# Patient Record
Sex: Male | Born: 1970 | Race: Black or African American | Hispanic: No | Marital: Married | Smoking: Never smoker
Health system: Southern US, Community
[De-identification: ages and names within clinical notes are randomized; demographics above are authoritative.]

## PROBLEM LIST (undated history)

## (undated) DIAGNOSIS — D649 Anemia, unspecified: Secondary | ICD-10-CM

## (undated) DIAGNOSIS — R7303 Prediabetes: Secondary | ICD-10-CM

## (undated) DIAGNOSIS — I10 Essential (primary) hypertension: Secondary | ICD-10-CM

## (undated) HISTORY — DX: Anemia, unspecified: D64.9

## (undated) HISTORY — DX: Essential (primary) hypertension: I10

## (undated) HISTORY — DX: Prediabetes: R73.03

---

## 2013-03-15 ENCOUNTER — Ambulatory Visit (INDEPENDENT_AMBULATORY_CARE_PROVIDER_SITE_OTHER): Payer: BC Managed Care – PPO | Admitting: Family Medicine

## 2013-03-15 VITALS — BP 148/112 | HR 69 | Temp 98.5°F | Resp 16 | Ht 66.0 in | Wt 155.0 lb

## 2013-03-15 DIAGNOSIS — N419 Inflammatory disease of prostate, unspecified: Secondary | ICD-10-CM

## 2013-03-15 DIAGNOSIS — R361 Hematospermia: Secondary | ICD-10-CM

## 2013-03-15 DIAGNOSIS — I10 Essential (primary) hypertension: Secondary | ICD-10-CM

## 2013-03-15 LAB — POCT CBC
Granulocyte percent: 73.2 %G (ref 37–80)
MCV: 90.3 fL (ref 80–97)
MID (cbc): 0.4 (ref 0–0.9)
MPV: 11.1 fL (ref 0–99.8)
POC LYMPH PERCENT: 22 %L (ref 10–50)
POC MID %: 4.8 %M (ref 0–12)
Platelet Count, POC: 225 10*3/uL (ref 142–424)
RDW, POC: 14 %

## 2013-03-15 LAB — POCT URINALYSIS DIPSTICK
Blood, UA: NEGATIVE
Nitrite, UA: NEGATIVE
Protein, UA: 30
Spec Grav, UA: >=1.03
Urobilinogen, UA: 1

## 2013-03-15 LAB — POCT UA - MICROSCOPIC ONLY
Crystals, Ur, HPF, POC: NEGATIVE
Yeast, UA: NEGATIVE

## 2013-03-15 LAB — COMPREHENSIVE METABOLIC PANEL
ALT: 18 U/L (ref 0–53)
AST: 19 U/L (ref 0–37)
Albumin: 4.4 g/dL (ref 3.5–5.2)
Alkaline Phosphatase: 51 U/L (ref 39–117)
Glucose, Bld: 92 mg/dL (ref 70–99)
Potassium: 4.4 mEq/L (ref 3.5–5.3)
Sodium: 140 mEq/L (ref 135–145)
Total Bilirubin: 0.9 mg/dL (ref 0.3–1.2)
Total Protein: 7.6 g/dL (ref 6.0–8.3)

## 2013-03-15 LAB — PSA: PSA: 1.46 ng/mL (ref <=4.00)

## 2013-03-15 MED ORDER — LISINOPRIL-HYDROCHLOROTHIAZIDE 10-12.5 MG PO TABS: 1.0000 | ORAL_TABLET | Freq: Every day | ORAL | Status: DC

## 2013-03-15 MED ORDER — CIPROFLOXACIN HCL 500 MG PO TABS: 500.0000 mg | ORAL_TABLET | Freq: Two times a day (BID) | ORAL | Status: DC

## 2013-03-15 NOTE — Patient Instructions (Addendum)
Take your medications faithfully  If more bleeding come in sooner, otherwise return in one month

## 2013-03-15 NOTE — Progress Notes (Signed)
Subjective  42 year old gentleman with a history of having had his blood pressure high when he went to a dentist not long ago. He was told that he could not have the procedure done due to the elevation of the blood pressure. He is a strong family history for high blood pressure apparently. He has never been treated for it.  He also has noticed blood in his semen on several occasions recently on her masturbated specimen. He knows of no trauma. Has had mild left lower quadrant pain but no significant pain. Had not seen this in the past. He has not been within the other woman since she's been with his wife. He knows of no reason for being concern about STDs.  He drinks about a half a dozen Pepsi's a day.   Objective: Healthy-appearing young man in no major distress. Chest clear. Heart regular. Abdomen soft nontender. Normal male external genitalia. Testes descended. No hernias. Digital rectal exam reveals prostate to be upper normal in size but no nodules were noted. No real tenderness to it.  Assessment: Hypertension Hematospermia  Plan: Check blood work and urinalysis  Results for orders placed in visit on 03/15/13  POCT GLYCOSYLATED HEMOGLOBIN (HGB A1C)      Result Value Range   Hemoglobin A1C 5.3    POCT CBC      Result Value Range   WBC 7.8  4.6 - 10.2 K/uL   Lymph, poc 1.7  0.6 - 3.4   POC LYMPH PERCENT 22.0  10 - 50 %L   MID (cbc) 0.4  0 - 0.9   POC MID % 4.8  0 - 12 %M   POC Granulocyte 5.7  2 - 6.9   Granulocyte percent 73.2  37 - 80 %G   RBC 5.05  4.69 - 6.13 M/uL   Hemoglobin 14.2  14.1 - 18.1 g/dL   HCT, POC 30.8  65.7 - 53.7 %   MCV 90.3  80 - 97 fL   MCH, POC 28.1  27 - 31.2 pg   MCHC 31.1 (*) 31.8 - 35.4 g/dL   RDW, POC 84.6     Platelet Count, POC 225  142 - 424 K/uL   MPV 11.1  0 - 99.8 fL  POCT URINALYSIS DIPSTICK      Result Value Range   Color, UA yellow     Clarity, UA slightly cloudy     Glucose, UA neg     Bilirubin, UA neg     Ketones, UA neg     Spec Grav, UA >=1.030     Blood, UA neg     pH, UA 5.5     Protein, UA 30     Urobilinogen, UA 1.0     Nitrite, UA neg     Leukocytes, UA Negative    POCT UA - MICROSCOPIC ONLY      Result Value Range   WBC, Ur, HPF, POC 1-3     RBC, urine, microscopic neg     Bacteria, U Microscopic neg     Mucus, UA neg     Epithelial cells, urine per micros 0-2     Crystals, Ur, HPF, POC neg     Casts, Ur, LPF, POC neg     Yeast, UA neg     Will treat with Cipro. Also will treat the high blood pressure. Have him return in 1 month.  Final diagnoses: Hematospermia Probable prostatitis Hypertension

## 2013-10-07 ENCOUNTER — Telehealth: Payer: Self-pay

## 2013-10-07 DIAGNOSIS — I10 Essential (primary) hypertension: Secondary | ICD-10-CM

## 2013-10-07 NOTE — Telephone Encounter (Signed)
Can send 1 mo but not 3, he needs follow up he states he will come in on Thursday, no need to send in the one month supply

## 2013-10-07 NOTE — Telephone Encounter (Signed)
Patient would like a 90 day supply on his lisinopril if possible to be sent to the [facility] on [address]

## 2014-03-20 ENCOUNTER — Ambulatory Visit (INDEPENDENT_AMBULATORY_CARE_PROVIDER_SITE_OTHER): Payer: BC Managed Care – PPO | Admitting: Family Medicine

## 2014-03-20 VITALS — BP 124/84 | HR 64 | Temp 98.2°F | Resp 16 | Ht 66.0 in | Wt 168.0 lb

## 2014-03-20 DIAGNOSIS — B49 Unspecified mycosis: Secondary | ICD-10-CM

## 2014-03-20 DIAGNOSIS — Z131 Encounter for screening for diabetes mellitus: Secondary | ICD-10-CM

## 2014-03-20 DIAGNOSIS — I10 Essential (primary) hypertension: Secondary | ICD-10-CM

## 2014-03-20 DIAGNOSIS — R0982 Postnasal drip: Secondary | ICD-10-CM

## 2014-03-20 LAB — POCT GLYCOSYLATED HEMOGLOBIN (HGB A1C): Hemoglobin A1C: 5.8

## 2014-03-20 MED ORDER — FLUTICASONE PROPIONATE 50 MCG/ACT NA SUSP: 2.0000 | Freq: Every day | NASAL | Status: DC

## 2014-03-20 MED ORDER — LISINOPRIL-HYDROCHLOROTHIAZIDE 10-12.5 MG PO TABS: 1.0000 | ORAL_TABLET | Freq: Every day | ORAL | Status: DC

## 2014-03-20 NOTE — Progress Notes (Signed)
Chief Complaint:  Chief Complaint  Patient presents with  . Medication Refill    HPI: George Richardson is a 43 y.o. male who is here for  HTN Refills Doing well, No SE except possible dry cough last 5 months, does not bother him does not want to change his ACEI to something that may not have cough SE He has fungus in nails of left hand middle and pinky finger He also has fungal infection on nail of his big toe Deneis diabetes.   Past Medical History  Diagnosis Date  . Anemia    No past surgical history on file. History   Social History  . Marital Status: Married    Spouse Name: N/A    Number of Children: N/A  . Years of Education: N/A   Social History Main Topics  . Smoking status: Never Smoker   . Smokeless tobacco: Not on file  . Alcohol Use: No  . Drug Use: No  . Sexual Activity: Yes    Partners: Female   Other Topics Concern  . Not on file   Social History Narrative  . No narrative on file   No family history on file. No Known Allergies Prior to Admission medications   Medication Sig Start Date End Date Taking? Authorizing Provider  lisinopril-hydrochlorothiazide (PRINZIDE,ZESTORETIC) 10-12.5 MG per tablet Take 1 tablet by mouth daily. 03/15/13  Yes Peyton Najjar, MD  ciprofloxacin (CIPRO) 500 MG tablet Take 1 tablet (500 mg total) by mouth 2 (two) times daily. 03/15/13   Peyton Najjar, MD     ROS: The patient denies fevers, chills, night sweats, unintentional weight loss, chest pain, palpitations, wheezing, dyspnea on exertion, nausea, vomiting, abdominal pain, dysuria, hematuria, melena, numbness, weakness, or tingling.   All other systems have been reviewed and were otherwise negative with the exception of those mentioned in the HPI and as above.    PHYSICAL EXAM: Filed Vitals:   03/20/14 1737  BP: 124/84  Pulse: 64  Temp: 98.2 F (36.8 C)  Resp: 16   Filed Vitals:   03/20/14 1737  Height: 5\' 6"  (1.676 m)  Weight: 168 lb (76.204 kg)    Body mass index is 27.13 kg/(m^2).  General: Alert, no acute distress HEENT:  Normocephalic, atraumatic, oropharynx patent. EOMI, PERRLA Cardiovascular:  Regular rate and rhythm, no rubs murmurs or gallops.  No Carotid bruits, radial pulse intact. No pedal edema.  Respiratory: Clear to auscultation bilaterally.  No wheezes, rales, or rhonchi.  No cyanosis, no use of accessory musculature GI: No organomegaly, abdomen is soft and non-tender, positive bowel sounds.  No masses. Skin: No rashes. Neurologic: Facial musculature symmetric. Psychiatric: Patient is appropriate throughout our interaction. Lymphatic: No cervical lymphadenopathy Musculoskeletal: Gait intact.   LABS: Results for orders placed in visit on 03/20/14  POCT GLYCOSYLATED HEMOGLOBIN (HGB A1C)      Result Value Ref Range   Hemoglobin A1C 5.8       EKG/XRAY:   Primary read interpreted by Dr. Conley Rolls at [facility].   ASSESSMENT/PLAN: Encounter Diagnoses  Name Primary?  . HTN (hypertension) Yes  . PND (post-nasal drip)   . Fungus infection   . Screening for diabetes mellitus    If CMP is normal then will give lamsil for 6 weeks then recheck if improve, if he stil has toe nail onychomycosis then will give additional 6 weeks if no liver issues.  F/u in 6 months or prn Trial of flonase to see if he has  PND induced cough since he has allergies, if cough continues to bother him/wife then will change  F/u prn  Gross sideeffects, risk and benefits, and alternatives of medications d/w patient. Patient is aware that all medications have potential sideeffects and we are unable to predict every sideeffect or drug-drug interaction that may occur.  Kuzey Ogata PHUONG, DO 03/20/2014 6:55 PM    03/22/14  Spoke with patient, kidny function worse so will dc lisinopril hctz since i think hctz is cuprit, since also possibly having dry cough with ace i then will change to ARB. F/u in 2 weeks with bp logs and repeat bmp in apptr office

## 2014-03-21 LAB — COMPREHENSIVE METABOLIC PANEL WITH GFR
Albumin: 4.5 g/dL (ref 3.5–5.2)
BUN: 13 mg/dL (ref 6–23)
CO2: 31 meq/L (ref 19–32)
Calcium: 10.1 mg/dL (ref 8.4–10.5)
Chloride: 99 meq/L (ref 96–112)
Creat: 1.61 mg/dL — ABNORMAL HIGH (ref 0.50–1.35)
Glucose, Bld: 89 mg/dL (ref 70–99)
Potassium: 4.3 meq/L (ref 3.5–5.3)

## 2014-03-21 LAB — COMPREHENSIVE METABOLIC PANEL
ALT: 24 U/L (ref 0–53)
AST: 23 U/L (ref 0–37)
Alkaline Phosphatase: 53 U/L (ref 39–117)
Sodium: 143 mEq/L (ref 135–145)
Total Bilirubin: 0.7 mg/dL (ref 0.2–1.2)
Total Protein: 7.9 g/dL (ref 6.0–8.3)

## 2014-03-21 LAB — MICROALBUMIN, URINE: Microalb, Ur: 0.5 mg/dL (ref 0.00–1.89)

## 2014-03-22 MED ORDER — LOSARTAN POTASSIUM 25 MG PO TABS: 25.0000 mg | ORAL_TABLET | Freq: Every day | ORAL | Status: DC

## 2014-03-22 MED ORDER — TERBINAFINE HCL 250 MG PO TABS: 250.0000 mg | ORAL_TABLET | Freq: Every day | ORAL | Status: DC

## 2014-04-25 ENCOUNTER — Encounter: Payer: Self-pay | Admitting: Family Medicine

## 2014-04-25 ENCOUNTER — Ambulatory Visit (INDEPENDENT_AMBULATORY_CARE_PROVIDER_SITE_OTHER): Payer: BC Managed Care – PPO | Admitting: Family Medicine

## 2014-04-25 VITALS — BP 140/96 | HR 65 | Temp 97.8°F | Resp 16 | Ht 66.0 in | Wt 179.8 lb

## 2014-04-25 DIAGNOSIS — I10 Essential (primary) hypertension: Secondary | ICD-10-CM

## 2014-04-25 DIAGNOSIS — R944 Abnormal results of kidney function studies: Secondary | ICD-10-CM

## 2014-04-25 LAB — COMPLETE METABOLIC PANEL WITH GFR
ALT: 21 U/L (ref 0–53)
BUN: 14 mg/dL (ref 6–23)
CO2: 27 mEq/L (ref 19–32)
Calcium: 9.5 mg/dL (ref 8.4–10.5)
Chloride: 106 mEq/L (ref 96–112)
Creat: 1.42 mg/dL — ABNORMAL HIGH (ref 0.50–1.35)
GFR, Est African American: 70 mL/min
GFR, Est Non African American: 60 mL/min
Glucose, Bld: 78 mg/dL (ref 70–99)
Sodium: 142 mEq/L (ref 135–145)
Total Bilirubin: 0.7 mg/dL (ref 0.2–1.2)

## 2014-04-25 LAB — COMPLETE METABOLIC PANEL WITHOUT GFR
AST: 23 U/L (ref 0–37)
Albumin: 4.5 g/dL (ref 3.5–5.2)
Alkaline Phosphatase: 47 U/L (ref 39–117)
Potassium: 3.9 meq/L (ref 3.5–5.3)
Total Protein: 7.1 g/dL (ref 6.0–8.3)

## 2014-04-25 LAB — MICROALBUMIN, URINE: Microalb, Ur: 0.5 mg/dL (ref 0.00–1.89)

## 2014-04-25 MED ORDER — LOSARTAN POTASSIUM 50 MG PO TABS: 50.0000 mg | ORAL_TABLET | Freq: Every day | ORAL | Status: DC

## 2014-04-25 NOTE — Progress Notes (Signed)
Chief Complaint:  Chief Complaint  Patient presents with  . Follow-up    HTN, recheck to see how med is working    HPI: George Richardson is a 43 y.o. male who is here for hTN follow-up. Dry cough is improved with switch from lisinopril HCTZ to losartan 25 mg.  HTN in 140-150s/90s on losartan 25 mg daily. He has had some deaths  in the family so diet has not been great and follow-up has not been great. NO SEs  Past Medical History  Diagnosis Date  . Anemia    No past surgical history on file. History   Social History  . Marital Status: Married    Spouse Name: N/A    Number of Children: N/A  . Years of Education: N/A   Social History Main Topics  . Smoking status: Never Smoker   . Smokeless tobacco: None  . Alcohol Use: No  . Drug Use: No  . Sexual Activity: Yes    Partners: Female   Other Topics Concern  . None   Social History Narrative  . None   No family history on file. No Known Allergies Prior to Admission medications   Medication Sig Start Date End Date Taking? Authorizing Provider  fluticasone (FLONASE) 50 MCG/ACT nasal spray Place 2 sprays into both nostrils daily. 03/20/14  Yes Thao P Le, DO  losartan (COZAAR) 25 MG tablet Take 1 tablet (25 mg total) by mouth daily. Blood pressure goal less than 150/90, recheck kidneys in 2 weeks 03/22/14  Yes Thao P Le, DO  terbinafine (LAMISIL) 250 MG tablet Take 1 tablet (250 mg total) by mouth daily. 03/22/14  Yes Thao P Le, DO     ROS: The patient denies fevers, chills, night sweats, unintentional weight loss, chest pain, palpitations, wheezing, dyspnea on exertion, nausea, vomiting, abdominal pain, dysuria, hematuria, melena, numbness, weakness, or tingling.   All other systems have been reviewed and were otherwise negative with the exception of those mentioned in the HPI and as above.    PHYSICAL EXAM: Filed Vitals:   04/25/14 0958  BP: 140/96  Pulse: 65  Temp: 97.8 F (36.6 C)  Resp: 16   Filed Vitals:    04/25/14 0958  Height: 5\' 6"  (1.676 m)  Weight: 179 lb 12.8 oz (81.557 kg)   Body mass index is 29.03 kg/(m^2).  General: Alert, no acute distress HEENT:  Normocephalic, atraumatic, oropharynx patent. EOMI, PERRLA, fundo exam normal Cardiovascular:  Regular rate and rhythm, no rubs murmurs or gallops.  No Carotid bruits, radial pulse intact. No pedal edema.  Respiratory: Clear to auscultation bilaterally.  No wheezes, rales, or rhonchi.  No cyanosis, no use of accessory musculature GI: No organomegaly, abdomen is soft and non-tender, positive bowel sounds.  No masses. Skin: No rashes. Neurologic: Facial musculature symmetric. Psychiatric: Patient is appropriate throughout our interaction. Lymphatic: No cervical lymphadenopathy Musculoskeletal: Gait intact.   LABS: Results for orders placed in visit on 03/20/14  COMPREHENSIVE METABOLIC PANEL      Result Value Ref Range   Sodium 143  135 - 145 mEq/L   Potassium 4.3  3.5 - 5.3 mEq/L   Chloride 99  96 - 112 mEq/L   CO2 31  19 - 32 mEq/L   Glucose, Bld 89  70 - 99 mg/dL   BUN 13  6 - 23 mg/dL   Creat 1.611.61 (*) 0.960.50 - 1.35 mg/dL   Total Bilirubin 0.7  0.2 - 1.2 mg/dL   Alkaline  Phosphatase 53  39 - 117 U/L   AST 23  0 - 37 U/L   ALT 24  0 - 53 U/L   Total Protein 7.9  6.0 - 8.3 g/dL   Albumin 4.5  3.5 - 5.2 g/dL   Calcium 40.910.1  8.4 - 81.110.5 mg/dL  MICROALBUMIN, URINE      Result Value Ref Range   Microalb, Ur 0.50  0.00 - 1.89 mg/dL  POCT GLYCOSYLATED HEMOGLOBIN (HGB A1C)      Result Value Ref Range   Hemoglobin A1C 5.8       EKG/XRAY:   Primary read [facility].   ASSESSMENT/PLAN: Encounter Diagnoses  Name Primary?  . HTN (hypertension) Yes  . Abnormal creatinine clearance glomerular filtration    Recheck CMP with GFR amd ,microalbumin Increase losartan to 50 mg daily, may need HCTZ he will call BP goals given 150/90 or less. F/u in 2 weeks by phone with BP , otherwise f/u in 6 months  Gross  sideeffects, risk and benefits, and alternatives of medications d/w patient. Patient is aware that all medications have potential sideeffects and we are unable to predict every sideeffect or drug-drug interaction that may occur.  Lenell Antuhao P Le, DO 04/25/2014 10:13 AM

## 2014-05-06 ENCOUNTER — Encounter: Payer: Self-pay | Admitting: Family Medicine

## 2014-05-21 ENCOUNTER — Other Ambulatory Visit: Payer: Self-pay | Admitting: Family Medicine

## 2014-05-22 ENCOUNTER — Ambulatory Visit: Payer: BC Managed Care – PPO

## 2014-05-22 ENCOUNTER — Ambulatory Visit (INDEPENDENT_AMBULATORY_CARE_PROVIDER_SITE_OTHER): Payer: BC Managed Care – PPO | Admitting: Family Medicine

## 2014-05-22 VITALS — BP 126/88 | HR 80 | Temp 98.5°F | Resp 20 | Ht 65.5 in | Wt 166.4 lb

## 2014-05-22 DIAGNOSIS — M25532 Pain in left wrist: Secondary | ICD-10-CM

## 2014-05-22 DIAGNOSIS — M25539 Pain in unspecified wrist: Secondary | ICD-10-CM

## 2014-05-22 DIAGNOSIS — M67439 Ganglion, unspecified wrist: Secondary | ICD-10-CM

## 2014-05-22 DIAGNOSIS — M6740 Ganglion, unspecified site: Secondary | ICD-10-CM

## 2014-05-22 NOTE — Patient Instructions (Signed)
Ganglion Cyst °A ganglion cyst is a noncancerous, fluid-filled lump that occurs near joints or tendons. The ganglion cyst grows out of a joint or the lining of a tendon. It most often develops in the hand or wrist but can also develop in the shoulder, elbow, hip, knee, ankle, or foot. The round or oval ganglion can be pea sized or larger than a grape. Increased activity may enlarge the size of the cyst because more fluid starts to build up.  °CAUSES  °It is not completely known what causes a ganglion cyst to grow. However, it may be related to: °· Inflammation or irritation around the joint. °· An injury. °· Repetitive movements or overuse. °· Arthritis. °SYMPTOMS  °A lump most often appears in the hand or wrist, but can occur in other areas of the body. Generally, the lump is painless without other symptoms. However, sometimes pain can be felt during activity or when pressure is applied to the lump. The lump may even be tender to the touch. Tingling, pain, numbness, or muscle weakness can occur if the ganglion cyst presses on a nerve. Your grip may be weak and you may have less movement in your joints.  °DIAGNOSIS  °Ganglion cysts are most often diagnosed based on a physical exam, noting where the cyst is and how it looks. Your caregiver will feel the lump and may shine a light alongside it. If it is a ganglion, a light often shines through it. Your caregiver may order an X-ray, ultrasound, or MRI to rule out other conditions. °TREATMENT  °Ganglions usually go away on their own without treatment. If pain or other symptoms are involved, treatment may be needed. Treatment is also needed if the ganglion limits your movement or if it gets infected. Treatment options include: °· Wearing a wrist or finger brace or splint. °· Taking anti-inflammatory medicine. °· Draining fluid from the lump with a needle (aspiration). °· Injecting a steroid into the joint. °· Surgery to remove the ganglion cyst and its stalk that is  attached to the joint or tendon. However, ganglion cysts can grow back. °HOME CARE INSTRUCTIONS  °· Do not press on the ganglion, poke it with a needle, or hit it with a heavy object. You may rub the lump gently and often. Sometimes fluid moves out of the cyst. °· Only take medicines as directed by your caregiver. °· Wear your brace or splint as directed by your caregiver. °SEEK MEDICAL CARE IF:  °· Your ganglion becomes larger or more painful. °· You have increased redness, red streaks, or swelling. °· You have pus coming from the lump. °· You have weakness or numbness in the affected area. °MAKE SURE YOU:  °· Understand these instructions. °· Will watch your condition. °· Will get help right away if you are not doing well or get worse. °Document Released: 12/09/2000 Document Revised: 09/05/2012 Document Reviewed: 02/05/2008 °[employer]® Patient Information ©2014 [employer]. ° °

## 2014-05-22 NOTE — Progress Notes (Signed)
Chief Complaint:  Chief Complaint  Patient presents with  . Wrist Injury    left wrist injury---pain to the wrist.  started yesterday afternoon.     HPI: George Richardson is a 43 y.o. male who is here for left hand numbness and tingling along the thumb distribution, he has been moving a lot and lifting and cleaning. He also works at [employer] at night.  Last 2-3 days worsening sxs where he has pain in addition to weakness with numbness and tingling. He work at [employer] and sp does a lot of lifting normally but never had this before. He states there was a lump on the inner side of his wrist that was fluid filled and large and today is smaller. He has not tried anything for this except otc meds. No prior history of ganglion cyst. No history of DM.   Past Medical History  Diagnosis Date  . Anemia   . Hypertension    No past surgical history on file. History   Social History  . Marital Status: Married    Spouse Name: N/A    Number of Children: N/A  . Years of Education: N/A   Social History Main Topics  . Smoking status: Never Smoker   . Smokeless tobacco: None  . Alcohol Use: No  . Drug Use: No  . Sexual Activity: Yes    Partners: Female   Other Topics Concern  . None   Social History Narrative  . None   History reviewed. No pertinent family history. No Known Allergies Prior to Admission medications   Medication Sig Start Date End Date Taking? Authorizing Provider  fluticasone (FLONASE) 50 MCG/ACT nasal spray Place 2 sprays into both nostrils daily. 03/20/14  Yes Renard Caperton P Frankee Gritz, DO  losartan (COZAAR) 50 MG tablet Take 1 tablet (50 mg total) by mouth daily. 04/25/14  Yes Zareth Rippetoe P Julyssa Kyer, DO  terbinafine (LAMISIL) 250 MG tablet TAKE 1 TABLET (250 MG TOTAL) BY MOUTH DAILY.   Yes Niobe Dick P Trexton Escamilla, DO     ROS: The patient denies fevers, chills, night sweats, unintentional weight loss, chest pain, palpitations, wheezing, dyspnea on exertion, nausea, vomiting, abdominal pain, dysuria, hematuria,  melena  All other systems have been reviewed and were otherwise negative with the exception of those mentioned in the HPI and as above.    PHYSICAL EXAM: Filed Vitals:   05/22/14 1949  BP: 126/88  Pulse: 80  Temp: 98.5 F (36.9 C)  Resp: 20   Filed Vitals:   05/22/14 1949  Height: 5' 5.5" (1.664 m)  Weight: 166 lb 6.4 oz (75.479 kg)   Body mass index is 27.26 kg/(m^2).  General: Alert, no acute distress HEENT:  Normocephalic, atraumatic, oropharynx patent. EOMI, PERRLA Cardiovascular:  Regular rate and rhythm, no rubs murmurs or gallops.  No Carotid bruits, radial pulse intact. No pedal edema.  Respiratory: Clear to auscultation bilaterally.  No wheezes, rales, or rhonchi.  No cyanosis, no use of accessory musculature GI: No organomegaly, abdomen is soft and non-tender, positive bowel sounds.  No masses. Skin: No rashes. Neurologic: Facial musculature symmetric. Psychiatric: Patient is appropriate throughout our interaction. Lymphatic: No cervical lymphadenopathy Musculoskeletal: Gait intact. Left wrist- full ROM, sensation intact, + tinels, 5/5 strength + ganglion cyst on dorsal asect of distal radius adjacent to radial artery    LABS: Results for orders placed in visit on 04/25/14  COMPLETE METABOLIC PANEL WITH GFR      Result Value Ref Range   Sodium  142  135 - 145 mEq/L   Potassium 3.9  3.5 - 5.3 mEq/L   Chloride 106  96 - 112 mEq/L   CO2 27  19 - 32 mEq/L   Glucose, Bld 78  70 - 99 mg/dL   BUN 14  6 - 23 mg/dL   Creat 8.67 (*) 6.72 - 1.35 mg/dL   Total Bilirubin 0.7  0.2 - 1.2 mg/dL   Alkaline Phosphatase 47  39 - 117 U/L   AST 23  0 - 37 U/L   ALT 21  0 - 53 U/L   Total Protein 7.1  6.0 - 8.3 g/dL   Albumin 4.5  3.5 - 5.2 g/dL   Calcium 9.5  8.4 - 09.4 mg/dL   GFR, Est African American 70     GFR, Est Non African American 60    MICROALBUMIN, URINE      Result Value Ref Range   Microalb, Ur 0.50  0.00 - 1.89 mg/dL     EKG/XRAY:   Primary read  interpreted by Dr. Conley Rolls at [facility]. No fx or dislocation + soft tissue swelling on distal radial aspect   ASSESSMENT/PLAN: Encounter Diagnoses  Name Primary?  . Left wrist pain Yes  . Ganglion cyst of wrist    Wrist splint with thumb spica Otc ibuprofen and/or tylenol prn F/u in 1 week or sooner by phone, refer to [facility] for eval if needed  Gross sideeffects, risk and benefits, and alternatives of medications d/w patient. Patient is aware that all medications have potential sideeffects and we are unable to predict every sideeffect or drug-drug interaction that may occur.  Lenell Antu, DO 05/22/2014 8:54 PM

## 2014-05-25 ENCOUNTER — Other Ambulatory Visit: Payer: Self-pay | Admitting: Family Medicine

## 2014-05-26 NOTE — Telephone Encounter (Signed)
I already refilled this? Go ahead and approve it. Thanks

## 2014-08-22 ENCOUNTER — Other Ambulatory Visit: Payer: Self-pay | Admitting: Physician Assistant

## 2014-08-26 ENCOUNTER — Other Ambulatory Visit: Payer: Self-pay | Admitting: Physician Assistant

## 2014-10-24 ENCOUNTER — Other Ambulatory Visit: Payer: Self-pay | Admitting: Family Medicine

## 2014-10-31 ENCOUNTER — Ambulatory Visit: Payer: BC Managed Care – PPO | Admitting: Family Medicine

## 2014-11-21 ENCOUNTER — Other Ambulatory Visit: Payer: Self-pay | Admitting: Family Medicine

## 2014-12-26 ENCOUNTER — Other Ambulatory Visit: Payer: Self-pay | Admitting: Family Medicine

## 2015-01-23 ENCOUNTER — Other Ambulatory Visit: Payer: Self-pay | Admitting: Physician Assistant

## 2015-02-11 ENCOUNTER — Other Ambulatory Visit: Payer: Self-pay | Admitting: Physician Assistant

## 2015-02-14 ENCOUNTER — Ambulatory Visit (INDEPENDENT_AMBULATORY_CARE_PROVIDER_SITE_OTHER): Payer: BLUE CROSS/BLUE SHIELD | Admitting: Physician Assistant

## 2015-02-14 VITALS — BP 148/92 | HR 79 | Temp 97.6°F | Resp 12 | Ht 66.0 in | Wt 169.0 lb

## 2015-02-14 DIAGNOSIS — I10 Essential (primary) hypertension: Secondary | ICD-10-CM

## 2015-02-14 LAB — COMPLETE METABOLIC PANEL WITH GFR
ALBUMIN: 4.3 g/dL (ref 3.5–5.2)
ALT: 23 U/L (ref 0–53)
AST: 22 U/L (ref 0–37)
Alkaline Phosphatase: 50 U/L (ref 39–117)
BILIRUBIN TOTAL: 0.9 mg/dL (ref 0.2–1.2)
BUN: 13 mg/dL (ref 6–23)
CO2: 29 mEq/L (ref 19–32)
Calcium: 9.5 mg/dL (ref 8.4–10.5)
Chloride: 104 mEq/L (ref 96–112)
Creat: 1.3 mg/dL (ref 0.50–1.35)
GFR, EST NON AFRICAN AMERICAN: 67 mL/min
GFR, Est African American: 77 mL/min
GLUCOSE: 86 mg/dL (ref 70–99)
Potassium: 4.2 mEq/L (ref 3.5–5.3)
Sodium: 140 mEq/L (ref 135–145)
Total Protein: 7.3 g/dL (ref 6.0–8.3)

## 2015-02-14 LAB — LIPID PANEL
CHOLESTEROL: 149 mg/dL (ref 0–200)
HDL: 42 mg/dL (ref >39)
LDL Cholesterol: 95 mg/dL (ref 0–99)
TRIGLYCERIDES: 61 mg/dL (ref <150)
Total CHOL/HDL Ratio: 3.5 Ratio
VLDL: 12 mg/dL (ref 0–40)

## 2015-02-14 MED ORDER — LOSARTAN POTASSIUM 50 MG PO TABS: ORAL_TABLET | ORAL | Status: DC

## 2015-02-14 MED ORDER — AMLODIPINE BESYLATE 5 MG PO TABS: 5.0000 mg | ORAL_TABLET | Freq: Every day | ORAL | Status: DC

## 2015-02-14 NOTE — Patient Instructions (Addendum)
-We are adding the amlodipine 5mg  to take once daily.  If you witness any side effects that are concerning contact lus.  Please check your BP 2x/week.  Jot it down on your phone/notebook, etc.  Bring that to your physical exam. -I will contact you on the results of your labs within the next 10 days.    DASH Eating Plan DASH stands for "Dietary Approaches to Stop Hypertension." The DASH eating plan is a healthy eating plan that has been shown to reduce high blood pressure (hypertension). Additional health benefits may include reducing the risk of type 2 diabetes mellitus, heart disease, and stroke. The DASH eating plan may also help with weight loss. WHAT DO I NEED TO KNOW ABOUT THE DASH EATING PLAN? For the DASH eating plan, you will follow these general guidelines:  Choose foods with a percent daily value for sodium of less than 5% (as listed on the food label).  Use salt-free seasonings or herbs instead of table salt or sea salt.  Check with your health care provider or pharmacist before using salt substitutes.  Eat lower-sodium products, often labeled as "lower sodium" or "no salt added."  Eat fresh foods.  Eat more vegetables, fruits, and low-fat dairy products.  Choose whole grains. Look for the word "whole" as the first word in the ingredient list.  Choose fish and skinless chicken or [place]key more often than red meat. Limit fish, poultry, and meat to 6 oz (170 g) each day.  Limit sweets, desserts, sugars, and sugary drinks.  Choose heart-healthy fats.  Limit cheese to 1 oz (28 g) per day.  Eat more home-cooked food and less restaurant, buffet, and fast food.  Limit fried foods.  Cook foods using methods other than frying.  Limit canned vegetables. If you do use them, rinse them well to decrease the sodium.  When eating at a restaurant, ask that your food be prepared with less salt, or no salt if possible. WHAT FOODS CAN I EAT? Seek help from a dietitian for individual  calorie needs. Grains Whole grain or whole wheat bread. Brown rice. Whole grain or whole wheat pasta. Quinoa, bulgur, and whole grain cereals. Low-sodium cereals. Corn or whole wheat flour tortillas. Whole grain cornbread. Whole grain crackers. Low-sodium crackers. Vegetables Fresh or frozen vegetables (raw, steamed, roasted, or grilled). Low-sodium or reduced-sodium tomato and vegetable juices. Low-sodium or reduced-sodium tomato sauce and paste. Low-sodium or reduced-sodium canned vegetables.  Fruits All fresh, canned (in natural juice), or frozen fruits. Meat and Other Protein Products Ground beef (85% or leaner), grass-fed beef, or beef trimmed of fat. Skinless chicken or [place]key. Ground chicken or [place]key. Pork trimmed of fat. All fish and seafood. Eggs. Dried beans, peas, or lentils. Unsalted nuts and seeds. Unsalted canned beans. Dairy Low-fat dairy products, such as skim or 1% milk, 2% or reduced-fat cheeses, low-fat ricotta or cottage cheese, or plain low-fat yogurt. Low-sodium or reduced-sodium cheeses. Fats and Oils Tub margarines without trans fats. Light or reduced-fat mayonnaise and salad dressings (reduced sodium). Avocado. Safflower, olive, or canola oils. Natural peanut or almond butter. Other Unsalted popcorn and pretzels. The items listed above may not be a complete list of recommended foods or beverages. Contact your dietitian for more options. WHAT FOODS ARE NOT RECOMMENDED? Grains White bread. White pasta. White rice. Refined cornbread. Bagels and croissants. Crackers that contain trans fat. Vegetables Creamed or fried vegetables. Vegetables in a cheese sauce. Regular canned vegetables. Regular canned tomato sauce and paste. Regular tomato and vegetable  juices. Fruits Dried fruits. Canned fruit in light or heavy syrup. Fruit juice. Meat and Other Protein Products Fatty cuts of meat. Ribs, chicken wings, bacon, sausage, bologna, salami, chitterlings, fatback, hot dogs,  bratwurst, and packaged luncheon meats. Salted nuts and seeds. Canned beans with salt. Dairy Whole or 2% milk, cream, half-and-half, and cream cheese. Whole-fat or sweetened yogurt. Full-fat cheeses or blue cheese. Nondairy creamers and whipped toppings. Processed cheese, cheese spreads, or cheese curds. Condiments Onion and garlic salt, seasoned salt, table salt, and sea salt. Canned and packaged gravies. Worcestershire sauce. Tartar sauce. Barbecue sauce. Teriyaki sauce. Soy sauce, including reduced sodium. Steak sauce. Fish sauce. Oyster sauce. Cocktail sauce. Horseradish. Ketchup and mustard. Meat flavorings and tenderizers. Bouillon cubes. Hot sauce. Tabasco sauce. Marinades. Taco seasonings. Relishes. Fats and Oils Butter, stick margarine, lard, shortening, ghee, and bacon fat. Coconut, palm kernel, or palm oils. Regular salad dressings. Other Pickles and olives. Salted popcorn and pretzels. The items listed above may not be a complete list of foods and beverages to avoid. Contact your dietitian for more information. WHERE CAN I FIND MORE INFORMATION? National Heart, Lung, and Blood Institute: [employer] Document Released: 12/01/2011 Document Revised: 04/28/2014 Document Reviewed: 10/16/2013 [facility] Patient Information 2015 [place], [place]. This information is not intended to replace advice given to you by your health care provider. Make sure you discuss any questions you have with your health care provider.

## 2015-02-14 NOTE — Progress Notes (Signed)
[facility] [address], [place] [id number] [phone]  Date:  02/14/2015   Name:  George Richardson   DOB:  05/25/1971   MRN:  [id number]  PCP:  Rockne CoonsLE, THAO PHUONG, DO    Chief Complaint: Medication Refill   History of Present Illness:  George Richardson is a 44 y.o. very pleasant male patient who [facility] of BP.  Patient states that he is tolerating the medication well.  He is compliant on medication.  He has no headaches which he was having with his htn symptoms.  He checks his BP 1-2x per month.  He lasts notes his BP around 148/87.  He denies chest pains, palpitations, leg swellings.   He works overnight at [employer]--eats a lot of snacks and fast foods, sodas.  He states that he was working on his weight change and had gotten down 155, but his second job has started picking up.  He is getting 2-3 hrs of sleep per day.    Past Medical History  Diagnosis Date  . Anemia   . Hypertension     History reviewed. No pertinent past surgical history.  History  Substance Use Topics  . Smoking status: Never Smoker   . Smokeless tobacco: Never Used  . Alcohol Use: No    History reviewed. No pertinent family history.  No Known Allergies  Medication list has been reviewed and updated.  Current Outpatient Prescriptions on File Prior to Visit  Medication Sig Dispense Refill  . losartan (COZAAR) 50 MG tablet TAKE 1 TABLET (50 MG TOTAL) BY MOUTH DAILY.  "NO MORE REFILLS WITHOUT OV" 15 tablet 0   No current facility-administered medications on file prior to visit.    Review of Systems: ROS otherwise unremarkable u  Physical Examination: Filed Vitals:   02/14/15 1337  BP: 148/92  Pulse: 79  Temp: 97.6 F (36.4 C)  Resp: 12   Filed Vitals:   02/14/15 1337  Height: 5\' 6"  (1.676 m)  Weight: 169 lb (76.658 kg)   Body mass index is 27.29 kg/(m^2). Ideal Body Weight: Weight in (lb) to have BMI = 25: 154.6  Wt Readings from Last 3  Encounters:  02/14/15 169 lb (76.658 kg)  05/22/14 166 lb 6.4 oz (75.479 kg)  04/25/14 179 lb 12.8 oz (81.557 kg)   Physical Exam  Constitutional: He is oriented to person, place, and time. He appears well-developed and well-nourished. No distress.  Eyes: EOM are normal. Pupils are equal, round, and reactive to light.  Neck: Normal range of motion. Neck supple. No thyromegaly present.  Cardiovascular: Normal rate, regular rhythm and normal heart sounds.  Exam reveals no friction rub.   No murmur heard. Pulses:      Dorsalis pedis pulses are 2+ on the right side, and 2+ on the left side.  Pulmonary/Chest: Effort normal and breath sounds normal. No respiratory distress. He has no wheezes.  Musculoskeletal: He exhibits no edema.  Neurological: He is alert and oriented to person, place, and time.      Assessment and Plan: 44 year old male is here today for follow up of HTN.  We discussed appropriate diet and sleep.  He plans to work this for up to  8 months.  Adding amlodipine 5mg  qd.  He will take BP 1-2x per week and record documentation.  rtc in 6 months for physical exam.  He will bring BP recordings to visit.   -Declines flu and tdap  Essential hypertension - Plan: losartan (COZAAR) 50 MG tablet, amLODipine (NORVASC) 5 MG tablet, COMPLETE METABOLIC PANEL WITH GFR, Lipid panel  Trena Platt, PA-C [facility] 2/22/20168:16 AM

## 2015-03-24 ENCOUNTER — Other Ambulatory Visit: Payer: Self-pay | Admitting: Family Medicine

## 2015-03-31 ENCOUNTER — Other Ambulatory Visit: Payer: Self-pay | Admitting: Physician Assistant

## 2015-04-07 ENCOUNTER — Ambulatory Visit (INDEPENDENT_AMBULATORY_CARE_PROVIDER_SITE_OTHER): Payer: BLUE CROSS/BLUE SHIELD | Admitting: Family Medicine

## 2015-04-07 VITALS — BP 130/82 | HR 82 | Temp 98.0°F | Resp 16 | Ht 66.0 in | Wt 168.5 lb

## 2015-04-07 DIAGNOSIS — R509 Fever, unspecified: Secondary | ICD-10-CM

## 2015-04-07 DIAGNOSIS — R059 Cough, unspecified: Secondary | ICD-10-CM

## 2015-04-07 DIAGNOSIS — R05 Cough: Secondary | ICD-10-CM | POA: Diagnosis not present

## 2015-04-07 LAB — POCT INFLUENZA A/B
INFLUENZA A, POC: NEGATIVE
INFLUENZA B, POC: NEGATIVE

## 2015-04-07 MED ORDER — HYDROCODONE-HOMATROPINE 5-1.5 MG/5ML PO SYRP: ORAL_SOLUTION | ORAL | Status: DC

## 2015-04-07 NOTE — Patient Instructions (Addendum)
I suspect you had the flu or a flu like illness.  No other testing today as your symptoms are improving, but if fever returns or cough is worsening - return for recheck.   Saline nasal spray at least 4 times per day if needed for nasal congestion, over the counter mucinex or mucinex DM as needed for cough (hydrocodone syrup if needed at bedtime for cough if needed) tylenol or ibuprofen over the counter for fever and body aches, and drink plenty of fluids. Other information as in instructions below.  Return to the clinic or go to the nearest emergency room if any of your symptoms worsen or new symptoms occur.   Influenza Influenza ("the flu") is a viral infection of the respiratory tract. It occurs more often in winter months because people spend more time in close contact with one another. Influenza can make you feel very sick. Influenza easily spreads from person to person (contagious). CAUSES  Influenza is caused by a virus that infects the respiratory tract. You can catch the virus by breathing in droplets from an infected person's cough or sneeze. You can also catch the virus by touching something that was recently contaminated with the virus and then touching your mouth, nose, or eyes. RISKS AND COMPLICATIONS You may be at risk for a more severe case of influenza if you smoke cigarettes, have diabetes, have chronic heart disease (such as heart failure) or lung disease (such as asthma), or if you have a weakened immune system. Elderly people and pregnant women are also at risk for more serious infections. The most common problem of influenza is a lung infection (pneumonia). Sometimes, this problem can require emergency medical care and may be life threatening. SIGNS AND SYMPTOMS  Symptoms typically last 4 to 10 days and may include:  Fever.  Chills.  Headache, body aches, and muscle aches.  Sore throat.  Chest discomfort and cough.  Poor appetite.  Weakness or feeling  tired.  Dizziness.  Nausea or vomiting. DIAGNOSIS  Diagnosis of influenza is often made based on your history and a physical exam. A nose or throat swab test can be done to confirm the diagnosis. TREATMENT  In mild cases, influenza goes away on its own. Treatment is directed at relieving symptoms. For more severe cases, your health care provider may prescribe antiviral medicines to shorten the sickness. Antibiotic medicines are not effective because the infection is caused by a virus, not by bacteria. HOME CARE INSTRUCTIONS  Take medicines only as directed by your health care provider.  Use a cool mist humidifier to make breathing easier.  Get plenty of rest until your temperature returns to normal. This usually takes 3 to 4 days.  Drink enough fluid to keep your urine clear or pale yellow.  Cover yourmouth and nosewhen coughing or sneezing,and wash your handswellto prevent thevirusfrom spreading.  Stay homefromwork orschool untilthe fever is gonefor at least 561full day. PREVENTION  An annual influenza vaccination (flu shot) is the best way to avoid getting influenza. An annual flu shot is now routinely recommended for all adults in the [place] SEEK MEDICAL CARE IF:  You experiencechest pain, yourcough worsens,or you producemore mucus.  Youhave nausea,vomiting, ordiarrhea.  Your fever returns or gets worse. SEEK IMMEDIATE MEDICAL CARE IF:  You havetrouble breathing, you become short of breath,or your skin ornails becomebluish.  You have severe painor stiffnessin the neck.  You develop a sudden headache, or pain in the face or ear.  You have nausea or vomiting that  you cannot control. MAKE SURE YOU:   Understand these instructions.  Will watch your condition.  Will get help right away if you are not doing well or get worse. Document Released: 12/09/2000 Document Revised: 04/28/2014 Document Reviewed: 03/12/2012 [facility] Patient Information 2015  [place], [place]. This information is not intended to replace advice given to you by your health care provider. Make sure you discuss any questions you have with your health care provider.

## 2015-04-07 NOTE — Progress Notes (Addendum)
Subjective:    Patient ID: George Richardson, male    DOB: July 25, 1971, 44 y.o.   MRN: [id number] This chart was scribed for Meredith Staggers, MD by Littie Deeds, Medical Scribe. This patient was seen in Room 12 and the patient's care was started at 10:18 AM.    HPI HPI Comments: George Richardson is a 44 y.o. male who [facility] complaining of gradual onset URI symptoms that started 4 days ago. Patient reports having productive cough, congestion, generalized myalgias, and headache. He also reports a fever of 101 F starting the following day. As of today, his symptoms have improved some overall. His cough is less productive now. He does note sick contacts with several family members at home, but he states their symptoms are not as bad as his. He did try Advil Cold & Flu once, but he did not try anything else.  Patient works 3rd shift at [employer].  There are no active problems to display for this patient.  Past Medical History  Diagnosis Date  . Anemia   . Hypertension    History reviewed. No pertinent past surgical history. No Known Allergies Prior to Admission medications   Medication Sig Start Date End Date Taking? Authorizing Provider  amLODipine (NORVASC) 5 MG tablet Take 1 tablet (5 mg total) by mouth daily. 02/14/15  Yes Stephanie D English, PA  losartan (COZAAR) 50 MG tablet TAKE 1 TABLET (50 MG TOTAL) BY MOUTH DAILY.  "NO MORE REFILLS WITHOUT OV" 02/14/15  Yes Garnetta Buddy, PA   History   Social History  . Marital Status: Married    Spouse Name: N/A  . Number of Children: N/A  . Years of Education: N/A   Occupational History  . Not on file.   Social History Main Topics  . Smoking status: Never Smoker   . Smokeless tobacco: Never Used  . Alcohol Use: No  . Drug Use: No  . Sexual Activity:    Partners: Female   Other Topics Concern  . Not on file   Social History Narrative     Review of Systems  Constitutional: Positive for fever.    HENT: Positive for congestion.   Respiratory: Positive for cough.   Musculoskeletal: Positive for myalgias.  Neurological: Positive for headaches.       Objective:   Physical Exam  Constitutional: He is oriented to person, place, and time. He appears well-developed and well-nourished.  HENT:  Head: Normocephalic and atraumatic.  Right Ear: Tympanic membrane, external ear and ear canal normal.  Left Ear: Tympanic membrane, external ear and ear canal normal.  Nose: No rhinorrhea. Right sinus exhibits no maxillary sinus tenderness and no frontal sinus tenderness. Left sinus exhibits no maxillary sinus tenderness and no frontal sinus tenderness.  Mouth/Throat: Oropharynx is clear and moist and mucous membranes are normal. No oropharyngeal exudate or posterior oropharyngeal erythema.  Eyes: Conjunctivae are normal. Pupils are equal, round, and reactive to light.  Neck: Neck supple.  No lymphadenopathy.  Cardiovascular: Normal rate, regular rhythm, normal heart sounds and intact distal pulses.   No murmur heard. Pulmonary/Chest: Effort normal and breath sounds normal. He has no wheezes. He has no rhonchi. He has no rales.  Abdominal: Soft. There is no tenderness.  Lymphadenopathy:    He has no cervical adenopathy.  Neurological: He is alert and oriented to person, place, and time.  Skin: Skin is warm and dry. No rash noted.  Psychiatric: He has a normal mood  and affect. His behavior is normal.  Vitals reviewed.     Filed Vitals:   04/07/15 0947  BP: 130/82  Pulse: 82  Temp: 98 F (36.7 C)  TempSrc: Oral  Resp: 16  Height:  (1.676 m)  Weight: 168 lb 8 oz (76.431 kg)  SpO2: 98%    Results for orders placed or performed in visit on 04/07/15  POCT Influenza A/B  Result Value Ref Range   Influenza A, POC Negative    Influenza B, POC Negative        Assessment & Plan:   [name] is a 44 y.o. male Fever, unspecified fever cause - Plan: POCT Influenza  A/B  Cough - Plan: POCT Influenza A/B  Suspected flu or flu-like illness, with improvement past 2 days.   -sx care with mucinex, hycodan if needed at bedtime, out of work note tonight, rtc precautions if sx's worsen.  No orders of the defined types were placed in this encounter.   Patient Instructions  I suspect you had the flu or a flu like illness.  No other testing today as your symptoms are improving, but if fever returns or cough is worsening - return for recheck.   Saline nasal spray at least 4 times per day if needed for nasal congestion, over the counter mucinex or mucinex DM as needed for cough (hydrocodone syrup if needed at bedtime for cough if needed) tylenol or ibuprofen over the counter for fever and body aches, and drink plenty of fluids. Other information as in instructions below.  Return to the clinic or go to the nearest emergency room if any of your symptoms worsen or new symptoms occur.   Influenza Influenza ("the flu") is a viral infection of the respiratory tract. It occurs more often in winter months because people spend more time in close contact with one another. Influenza can make you feel very sick. Influenza easily spreads from person to person (contagious). CAUSES  Influenza is caused by a virus that infects the respiratory tract. You can catch the virus by breathing in droplets from an infected person's cough or sneeze. You can also catch the virus by touching something that was recently contaminated with the virus and then touching your mouth, nose, or eyes. RISKS AND COMPLICATIONS You may be at risk for a more severe case of influenza if you smoke cigarettes, have diabetes, have chronic heart disease (such as heart failure) or lung disease (such as asthma), or if you have a weakened immune system. Elderly people and pregnant women are also at risk for more serious infections. The most common problem of influenza is a lung infection (pneumonia). Sometimes, this  problem can require emergency medical care and may be life threatening. SIGNS AND SYMPTOMS  Symptoms typically last 4 to 10 days and may include:  Fever.  Chills.  Headache, body aches, and muscle aches.  Sore throat.  Chest discomfort and cough.  Poor appetite.  Weakness or feeling tired.  Dizziness.  Nausea or vomiting. DIAGNOSIS  Diagnosis of influenza is often made based on your history and a physical exam. A nose or throat swab test can be done to confirm the diagnosis. TREATMENT  In mild cases, influenza goes away on its own. Treatment is directed at relieving symptoms. For more severe cases, your health care provider may prescribe antiviral medicines to shorten the sickness. Antibiotic medicines are not effective because the infection is caused by a virus, not by bacteria. HOME CARE INSTRUCTIONS  Take medicines  only as directed by your health care provider.  Use a cool mist humidifier to make breathing easier.  Get plenty of rest until your temperature returns to normal. This usually takes 3 to 4 days.  Drink enough fluid to keep your urine clear or pale yellow.  Cover yourmouth and nosewhen coughing or sneezing,and wash your handswellto prevent thevirusfrom spreading.  Stay homefromwork orschool untilthe fever is gonefor at least 701full day. PREVENTION  An annual influenza vaccination (flu shot) is the best way to avoid getting influenza. An annual flu shot is now routinely recommended for all adults in the [place] SEEK MEDICAL CARE IF:  You experiencechest pain, yourcough worsens,or you producemore mucus.  Youhave nausea,vomiting, ordiarrhea.  Your fever returns or gets worse. SEEK IMMEDIATE MEDICAL CARE IF:  You havetrouble breathing, you become short of breath,or your skin ornails becomebluish.  You have severe painor stiffnessin the neck.  You develop a sudden headache, or pain in the face or ear.  You have nausea or vomiting  that you cannot control. MAKE SURE YOU:   Understand these instructions.  Will watch your condition.  Will get help right away if you are not doing well or get worse. Document Released: 12/09/2000 Document Revised: 04/28/2014 Document Reviewed: 03/12/2012 [facility] Patient Information 2015 [place], [place]. This information is not intended to replace advice given to you by your health care provider. Make sure you discuss any questions you have with your health care provider.    I personally performed the services described in this documentation, which was scribed in my presence. The recorded information has been reviewed and considered, and addended by me as needed.

## 2015-08-11 ENCOUNTER — Other Ambulatory Visit: Payer: Self-pay | Admitting: Physician Assistant

## 2015-09-20 ENCOUNTER — Other Ambulatory Visit: Payer: Self-pay | Admitting: Family Medicine

## 2015-10-23 ENCOUNTER — Other Ambulatory Visit: Payer: Self-pay | Admitting: Physician Assistant

## 2015-11-29 ENCOUNTER — Other Ambulatory Visit: Payer: Self-pay | Admitting: Physician Assistant

## 2015-12-17 ENCOUNTER — Other Ambulatory Visit: Payer: Self-pay | Admitting: Physician Assistant

## 2016-01-01 ENCOUNTER — Ambulatory Visit (INDEPENDENT_AMBULATORY_CARE_PROVIDER_SITE_OTHER): Payer: BLUE CROSS/BLUE SHIELD | Admitting: Physician Assistant

## 2016-01-01 VITALS — BP 128/80 | HR 66 | Temp 98.2°F | Resp 16 | Ht 67.0 in | Wt 177.0 lb

## 2016-01-01 DIAGNOSIS — I10 Essential (primary) hypertension: Secondary | ICD-10-CM | POA: Diagnosis not present

## 2016-01-01 DIAGNOSIS — G4700 Insomnia, unspecified: Secondary | ICD-10-CM | POA: Diagnosis not present

## 2016-01-01 LAB — COMPREHENSIVE METABOLIC PANEL
ALBUMIN: 4.5 g/dL (ref 3.6–5.1)
ALK PHOS: 51 U/L (ref 40–115)
ALT: 19 U/L (ref 9–46)
AST: 19 U/L (ref 10–40)
BILIRUBIN TOTAL: 0.8 mg/dL (ref 0.2–1.2)
BUN: 11 mg/dL (ref 7–25)
CALCIUM: 9.7 mg/dL (ref 8.6–10.3)
CO2: 30 mmol/L (ref 20–31)
Chloride: 104 mmol/L (ref 98–110)
Creat: 1.48 mg/dL — ABNORMAL HIGH (ref 0.60–1.35)
Glucose, Bld: 92 mg/dL (ref 65–99)
Potassium: 4.4 mmol/L (ref 3.5–5.3)
Sodium: 139 mmol/L (ref 135–146)
TOTAL PROTEIN: 7.4 g/dL (ref 6.1–8.1)

## 2016-01-01 LAB — CBC
HCT: 43.7 % (ref 39.0–52.0)
Hemoglobin: 14.6 g/dL (ref 13.0–17.0)
MCH: 28.7 pg (ref 26.0–34.0)
MCHC: 33.4 g/dL (ref 30.0–36.0)
MCV: 85.9 fL (ref 78.0–100.0)
MPV: 11 fL (ref 8.6–12.4)
PLATELETS: 240 10*3/uL (ref 150–400)
RBC: 5.09 MIL/uL (ref 4.22–5.81)
RDW: 14.8 % (ref 11.5–15.5)
WBC: 7.7 10*3/uL (ref 4.0–10.5)

## 2016-01-01 MED ORDER — AMLODIPINE BESYLATE 5 MG PO TABS: 5.0000 mg | ORAL_TABLET | Freq: Every day | ORAL | Status: DC

## 2016-01-01 MED ORDER — LOSARTAN POTASSIUM 50 MG PO TABS: 50.0000 mg | ORAL_TABLET | Freq: Every day | ORAL | Status: DC

## 2016-01-01 MED ORDER — TRAZODONE HCL 50 MG PO TABS: 25.0000 mg | ORAL_TABLET | Freq: Every evening | ORAL | Status: DC | PRN

## 2016-01-01 NOTE — Progress Notes (Signed)
[facility] [address] [place] [address] [phone]  Date:  01/01/2016   Name:  George Richardson   DOB:  03-02-1971   MRN:  [id number]  PCP:  Rockne Coons, DO    Chief Complaint: Medication Refill and OTHER   History of Present Illness:  This is a 45 y.o. male with PMH HTN who is presenting for med refills.  He was last seen here 02/14/15 for his HTN. At that time, BP not quite controlled and amlodipine added. He is doing very well on amlodipine 5 mg and losartan 50 mg.   Lipid panel 11 months ago normal.   He is also complaining of difficulty sleeping for the past 6-7 months. States he is working 70 hours a week, two jobs. He works a full time 3rd shift job. Then works a part time day job. Has problem with getting to sleep and staying asleep. Getting on average 4 hours of sleep per day. States he lays down to go to sleep but ends up laying there wide awake. He eventually gets up and does something else, like cleaning. He doesn't drink alcohol or use tobacco. He does drink 1 cup of coffee and 5-7 regular pepsis during the day. If he doesn't drink the pepsis he gets a headache. Exercises three times a week 15-20 minutes at a time doing sit ups and push ups. He has never tried anything for sleep aid. No hx OSA, doesn't snore.  Review of Systems:  Review of Systems See HPI  There are no active problems to display for this patient.   Prior to Admission medications   Medication Sig Start Date End Date Taking? Authorizing Provider  amLODipine (NORVASC) 5 MG tablet Take 1 tablet (5 mg total) by mouth daily. NO MORE REFILLS WITHOUT OFFICE VISIT - 2ND NOTICE 12/01/15  Yes Lanier Clam V, PA-C  losartan (COZAAR) 50 MG tablet Take 1 tablet (50 mg total) by mouth daily. NO MORE REFILLS WITHOUT OFFICE VISIT - 2ND NOTICE 12/01/15  Yes Lanier Clam V, PA-C    No Known Allergies  History reviewed. No pertinent past surgical history.  Social History  Substance Use  Topics  . Smoking status: Never Smoker   . Smokeless tobacco: Never Used  . Alcohol Use: No    History reviewed. No pertinent family history.  Medication list has been reviewed and updated.  Physical Examination:  Physical Exam  Constitutional: He is oriented to person, place, and time. He appears well-developed and well-nourished. No distress.  HENT:  Head: Normocephalic and atraumatic.  Right Ear: Hearing normal.  Left Ear: Hearing normal.  Nose: Nose normal.  Eyes: Conjunctivae and lids are normal. Right eye exhibits no discharge. Left eye exhibits no discharge. No scleral icterus.  Neck: Trachea normal. Carotid bruit is not present. No thyromegaly present.  Cardiovascular: Normal rate, regular rhythm, normal heart sounds and normal pulses.   No murmur heard. Pulmonary/Chest: Effort normal and breath sounds normal. No respiratory distress. He has no wheezes. He has no rhonchi. He has no rales.  Musculoskeletal: Normal range of motion.  Neurological: He is alert and oriented to person, place, and time.  Skin: Skin is warm, dry and intact. No lesion and no rash noted.  Psychiatric: He has a normal mood and affect. His speech is normal and behavior is normal. Thought content normal.   BP 128/80 mmHg  Pulse 66  Temp(Src) 98.2 F (36.8 C) (Oral)  Resp 16  Ht 5'  7" (1.702 m)  Wt 177 lb (80.287 kg)  BMI 27.72 kg/m2  SpO2 98%  Assessment and Plan:  1. Insomnia We discussed the need to decrease caffeine to no more than 3 beverages a day and no caffeine within 8 hours of wanting to go to sleep. He may take trazodone 25-50 mg before bed. TSH pending. Return if no improvement in 1-2 months. - TSH - traZODone (DESYREL) 50 MG tablet; Take 0.5-1 tablets (25-50 mg total) by mouth at bedtime as needed for sleep.  Dispense: 30 tablet; Refill: 3  2. Essential hypertension Stable. Meds refilled. - Comprehensive metabolic panel - CBC - amLODipine (NORVASC) 5 MG tablet; Take 1 tablet  (5 mg total) by mouth daily.  Dispense: 90 tablet; Refill: 1 - losartan (COZAAR) 50 MG tablet; Take 1 tablet (50 mg total) by mouth daily.  Dispense: 90 tablet; Refill: 1   Mackensie Pilson V. Dyke BrackettBush, PA-C, MHS [facility]  01/03/2016

## 2016-01-01 NOTE — Patient Instructions (Signed)
BP meds refilled. i will call you with your lab results. Cut back on your caffeinated beverages. No more than 3 in a day and you should not have anything caffeinated within 8 hours of when you want to go to sleep. Take trazodone 1/2 to 1 tab as needed for sleep. Return in 6 months for follow up or sooner if needed.

## 2016-01-02 LAB — TSH: TSH: 2.01 u[IU]/mL (ref 0.350–4.500)

## 2016-01-08 ENCOUNTER — Telehealth: Payer: Self-pay | Admitting: *Deleted

## 2016-01-08 DIAGNOSIS — I10 Essential (primary) hypertension: Secondary | ICD-10-CM

## 2016-01-08 DIAGNOSIS — G4700 Insomnia, unspecified: Secondary | ICD-10-CM

## 2016-01-08 MED ORDER — AMLODIPINE BESYLATE 5 MG PO TABS: 5.0000 mg | ORAL_TABLET | Freq: Every day | ORAL | Status: DC

## 2016-01-08 MED ORDER — TRAZODONE HCL 50 MG PO TABS: 25.0000 mg | ORAL_TABLET | Freq: Every evening | ORAL | Status: DC | PRN

## 2016-01-08 NOTE — Telephone Encounter (Signed)
trazadone and amlodipine was not received by pharmacy.  rx's resent in again.

## 2016-07-03 ENCOUNTER — Other Ambulatory Visit: Payer: Self-pay | Admitting: Physician Assistant

## 2016-07-28 ENCOUNTER — Ambulatory Visit: Payer: BLUE CROSS/BLUE SHIELD | Admitting: Physician Assistant

## 2016-07-28 VITALS — BP 140/78 | HR 88 | Temp 98.0°F | Resp 18 | Ht 67.0 in | Wt 176.0 lb

## 2016-07-28 DIAGNOSIS — Z114 Encounter for screening for human immunodeficiency virus [HIV]: Secondary | ICD-10-CM

## 2016-07-28 DIAGNOSIS — I10 Essential (primary) hypertension: Secondary | ICD-10-CM | POA: Diagnosis not present

## 2016-07-28 DIAGNOSIS — R05 Cough: Secondary | ICD-10-CM | POA: Diagnosis not present

## 2016-07-28 DIAGNOSIS — R059 Cough, unspecified: Secondary | ICD-10-CM

## 2016-07-28 LAB — COMPLETE METABOLIC PANEL WITH GFR
ALT: 20 U/L (ref 9–46)
AST: 18 U/L (ref 10–40)
Albumin: 4.4 g/dL (ref 3.6–5.1)
Alkaline Phosphatase: 65 U/L (ref 40–115)
BUN: 14 mg/dL (ref 7–25)
CHLORIDE: 104 mmol/L (ref 98–110)
CO2: 27 mmol/L (ref 20–31)
CREATININE: 1.46 mg/dL — AB (ref 0.60–1.35)
Calcium: 9.5 mg/dL (ref 8.6–10.3)
GFR, Est African American: 67 mL/min (ref >=60)
GFR, Est Non African American: 58 mL/min — ABNORMAL LOW (ref >=60)
Glucose, Bld: 93 mg/dL (ref 65–99)
Potassium: 3.9 mmol/L (ref 3.5–5.3)
Sodium: 141 mmol/L (ref 135–146)
Total Bilirubin: 0.5 mg/dL (ref 0.2–1.2)
Total Protein: 7.6 g/dL (ref 6.1–8.1)

## 2016-07-28 MED ORDER — LOSARTAN POTASSIUM 50 MG PO TABS: 50.0000 mg | ORAL_TABLET | Freq: Every day | ORAL | 0 refills | Status: DC

## 2016-07-28 MED ORDER — BENZONATATE 100 MG PO CAPS: 100.0000 mg | ORAL_CAPSULE | Freq: Three times a day (TID) | ORAL | 0 refills | Status: DC | PRN

## 2016-07-28 MED ORDER — HYDROCODONE-HOMATROPINE 5-1.5 MG/5ML PO SYRP: 5.0000 mL | ORAL_SOLUTION | Freq: Three times a day (TID) | ORAL | 0 refills | Status: DC | PRN

## 2016-07-28 MED ORDER — AMLODIPINE BESYLATE 10 MG PO TABS: 10.0000 mg | ORAL_TABLET | Freq: Every day | ORAL | 0 refills | Status: DC

## 2016-07-28 NOTE — Patient Instructions (Addendum)
Return in 6 weeks for bp recheck, continue to check bp daily, I would like your top number to be around 120-130. Watch for any worsening of leg swelling.   For cough:  - I recommend you rest, drink plenty of fluids, eat light meals including soups.  - You may use cough syrup at night for your cough and sore throat, Tessalon pearls during the day. Be aware that cough syrup can definitely make you drowsy and sleepy so do not drive or operate any heavy machinery if it is affecting you during the day.  - You may also use Tylenol or ibuprofen over-the-counter for your sore throat.  - Please let me know if you are not seeing any improvement or get worse in 7 days.     IF you received an x-ray today, you will receive an invoice from [facility]. Please contact [facility] at [phone] with questions or concerns regarding your invoice.   IF you received labwork today, you will receive an invoice from [employer]. Please contact [facility] at [phone] with questions or concerns regarding your invoice.   Our billing staff will not be able to assist you with questions regarding bills from these companies.  You will be contacted with the lab results as soon as they are available. The fastest way to get your results is to activate your My Chart account. Instructions are located on the last page of this paperwork. If you have not heard from [place] regarding the results in 2 weeks, please contact this office.

## 2016-07-28 NOTE — Progress Notes (Signed)
George Richardson  MRN: [id number] DOB: 05/12/1971  Subjective:  George Richardson is a 45 y.o. male seen in office today for a chief complaint of medication refills of losartan and amlodipine for hypertension. States that htn is well controlled. Takes bp at home. Systolic pressure typically runs around140. Denies headache, lightheadedness, visual disturbance, and chest pain. Has associated minimal leg swelling intermittently.   One concern for pt today is dry cough x 1 week.Has asociated congestion, sleep disturbance due to cough, and sore throat. Denies fever, SOB, and chills. Has tried robitussin cough and cold with relief. Not around any sick contacts at home or work.   Review of Systems  All other systems reviewed and are negative.   There are no active problems to display for this patient.   Current Outpatient Prescriptions on File Prior to Visit  Medication Sig Dispense Refill  . amLODipine (NORVASC) 5 MG tablet TAKE 1 TABLET (5 MG TOTAL) BY MOUTH DAILY. 30 tablet 0  . losartan (COZAAR) 50 MG tablet TAKE 1 TABLET BY MOUTH EVERY DAY 30 tablet 0  . traZODone (DESYREL) 50 MG tablet Take 0.5-1 tablets (25-50 mg total) by mouth at bedtime as needed for sleep. (Patient not taking: Reported on 07/28/2016) 30 tablet 3   No current facility-administered medications on file prior to visit.    No Known Allergies  Objective:  BP 140/78   Pulse 88   Temp 98 F (36.7 C) (Oral)   Resp 18   Ht 5\' 7"  (1.702 m)   Wt 176 lb (79.8 kg)   SpO2 98%   BMI 27.57 kg/m    Physical Exam  Constitutional: He is oriented to person, place, and time and well-developed, well-nourished, and in no distress.  HENT:  Head: Normocephalic and atraumatic.  Right Ear: Tympanic membrane, external ear and ear canal normal.  Left Ear: Tympanic membrane, external ear and ear canal normal.  Nose: Nose normal.  Mouth/Throat: No posterior oropharyngeal erythema.  Eyes: Conjunctivae are normal.  Neck: Normal range of  motion.  Pulmonary/Chest: Effort normal and breath sounds normal.  Musculoskeletal:       Right lower leg: Normal.       Left lower leg: Normal.  Neurological: He is alert and oriented to person, place, and time. Gait normal.  Skin: Skin is warm and dry.  Psychiatric: Affect normal.  Vitals reviewed.   Assessment and Plan :   1. Essential hypertension -Discontinue amlodipine 5mg  and begin amlodipine 10mg  daily - COMPLETE METABOLIC PANEL WITH GFR - losartan (COZAAR) 50 MG tablet; Take 1 tablet (50 mg total) by mouth daily.  Dispense: 90 tablet; Refill: 0 - amLODipine (NORVASC) 10 MG tablet; Take 1 tablet (10 mg total) by mouth daily.  Dispense: 90 tablet; Refill: 0 -Pt's insurance covers 90 day prescriptions. I have given one prescription today. Pt told to follow up in 6 weeks for BP recheck. If patient tolerating medication well and bp is well controlled will give 6 months of medication.   2. Screening for HIV (human immunodeficiency virus) - HIV antibody  3. Cough - HYDROcodone-homatropine (HYCODAN) 5-1.5 MG/5ML syrup; Take 5 mLs by mouth every 8 (eight) hours as needed for cough.  Dispense: 120 mL; Refill: 0 - benzonatate (TESSALON) 100 MG capsule; Take 1-2 capsules (100-200 mg total) by mouth 3 (three) times daily as needed for cough.  Dispense: 40 capsule; Refill: 0 -Tylenol or ibuprofen prn for sore throat  Benjiman Core PA-C  [facility] 07/28/2016 12:26 PM

## 2016-07-29 LAB — HIV ANTIBODY (ROUTINE TESTING W REFLEX): HIV 1&2 Ab, 4th Generation: NONREACTIVE

## 2016-08-05 ENCOUNTER — Other Ambulatory Visit: Payer: Self-pay | Admitting: Urgent Care

## 2016-10-24 ENCOUNTER — Other Ambulatory Visit: Payer: Self-pay

## 2016-10-24 DIAGNOSIS — I10 Essential (primary) hypertension: Secondary | ICD-10-CM

## 2016-10-24 MED ORDER — LOSARTAN POTASSIUM 50 MG PO TABS: 50.0000 mg | ORAL_TABLET | Freq: Every day | ORAL | 0 refills | Status: DC

## 2016-10-24 MED ORDER — AMLODIPINE BESYLATE 10 MG PO TABS: 10.0000 mg | ORAL_TABLET | Freq: Every day | ORAL | 0 refills | Status: DC

## 2016-10-28 ENCOUNTER — Other Ambulatory Visit: Payer: Self-pay

## 2016-10-28 DIAGNOSIS — I10 Essential (primary) hypertension: Secondary | ICD-10-CM

## 2016-10-28 MED ORDER — LOSARTAN POTASSIUM 50 MG PO TABS: 50.0000 mg | ORAL_TABLET | Freq: Every day | ORAL | 0 refills | Status: DC

## 2016-10-28 MED ORDER — AMLODIPINE BESYLATE 10 MG PO TABS: 10.0000 mg | ORAL_TABLET | Freq: Every day | ORAL | 0 refills | Status: DC

## 2017-01-25 ENCOUNTER — Other Ambulatory Visit: Payer: Self-pay | Admitting: Physician Assistant

## 2017-01-25 DIAGNOSIS — I10 Essential (primary) hypertension: Secondary | ICD-10-CM

## 2017-01-25 NOTE — Telephone Encounter (Signed)
Please schedule an appt with Benjiman CoreBrittany Wiseman for the patient within the next month

## 2017-02-19 ENCOUNTER — Other Ambulatory Visit: Payer: Self-pay | Admitting: Physician Assistant

## 2017-02-19 DIAGNOSIS — I10 Essential (primary) hypertension: Secondary | ICD-10-CM

## 2017-02-20 NOTE — Telephone Encounter (Signed)
Meds ordered this encounter  Medications  . losartan (COZAAR) 50 MG tablet    Sig: TAKE 1 TABLET (50 MG TOTAL) BY MOUTH DAILY.    Dispense:  90 tablet    Refill:  0    Please advise patient he needs visit for additional fills.  Marland Kitchen. amLODipine (NORVASC) 10 MG tablet    Sig: TAKE 1 TABLET (10 MG TOTAL) BY MOUTH DAILY.    Dispense:  90 tablet    Refill:  0    Please advise patient he needs visit for additional fills.    Please contact patient to schedule follow-up of HTN.

## 2017-02-21 ENCOUNTER — Ambulatory Visit (INDEPENDENT_AMBULATORY_CARE_PROVIDER_SITE_OTHER): Payer: BLUE CROSS/BLUE SHIELD | Admitting: Physician Assistant

## 2017-02-21 VITALS — BP 172/100 | HR 62 | Temp 98.4°F | Resp 16 | Ht 67.0 in | Wt 174.0 lb

## 2017-02-21 DIAGNOSIS — R030 Elevated blood-pressure reading, without diagnosis of hypertension: Secondary | ICD-10-CM

## 2017-02-21 DIAGNOSIS — Z23 Encounter for immunization: Secondary | ICD-10-CM

## 2017-02-21 DIAGNOSIS — Z79899 Other long term (current) drug therapy: Secondary | ICD-10-CM | POA: Diagnosis not present

## 2017-02-21 DIAGNOSIS — I10 Essential (primary) hypertension: Secondary | ICD-10-CM | POA: Diagnosis not present

## 2017-02-21 MED ORDER — AMLODIPINE BESYLATE 10 MG PO TABS: 10.0000 mg | ORAL_TABLET | Freq: Every day | ORAL | 0 refills | Status: DC

## 2017-02-21 MED ORDER — LOSARTAN POTASSIUM 50 MG PO TABS: 50.0000 mg | ORAL_TABLET | Freq: Every day | ORAL | 0 refills | Status: DC

## 2017-02-21 NOTE — Patient Instructions (Addendum)
Please check and see if you are taking 59m or 178mof Amlodipine. Take your blood pressure a couple of times at home. Come back in one month for recheck!!!!!!!!    Thank you for coming in today. I hope you feel we met your needs.  Feel free to call [facility] if you have any questions or further requests.  Please consider signing up for MyChart if you do not already have it, as this is a great way to communicate with me.  Best,  Whitney McVey, PA-C  DASH Eating Plan DASH stands for "Dietary Approaches to Stop Hypertension." The DASH eating plan is a healthy eating plan that has been shown to reduce high blood pressure (hypertension). It may also reduce your risk for type 2 diabetes, heart disease, and stroke. The DASH eating plan may also help with weight loss. What are tips for following this plan? General guidelines   Avoid eating more than 2,300 mg (milligrams) of salt (sodium) a day. If you have hypertension, you may need to reduce your sodium intake to 1,500 mg a day.  Limit alcohol intake to no more than 1 drink a day for nonpregnant women and 2 drinks a day for men. One drink equals 12 oz of beer, 5 oz of wine, or 1 oz of hard liquor.  Work with your health care provider to maintain a healthy body weight or to lose weight. Ask what an ideal weight is for you.  Get at least 30 minutes of exercise that causes your heart to beat faster (aerobic exercise) most days of the week. Activities may include walking, swimming, or biking.  Work with your health care provider or diet and nutrition specialist (dietitian) to adjust your eating plan to your individual calorie needs. Reading food labels   Check food labels for the amount of sodium per serving. Choose foods with less than 5 percent of the Daily Value of sodium. Generally, foods with less than 300 mg of sodium per serving fit into this eating plan.  To find whole grains, look for the word "whole" as the first word in the ingredient  list. Shopping   Buy products labeled as "low-sodium" or "no salt added."  Buy fresh foods. Avoid canned foods and premade or frozen meals. Cooking   Avoid adding salt when cooking. Use salt-free seasonings or herbs instead of table salt or sea salt. Check with your health care provider or pharmacist before using salt substitutes.  Do not fry foods. Cook foods using healthy methods such as baking, boiling, grilling, and broiling instead.  Cook with heart-healthy oils, such as olive, canola, soybean, or sunflower oil. Meal planning    Eat a balanced diet that includes:  5 or more servings of fruits and vegetables each day. At each meal, try to fill half of your plate with fruits and vegetables.  Up to 6-8 servings of whole grains each day.  Less than 6 oz of lean meat, poultry, or fish each day. A 3-oz serving of meat is about the same size as a deck of cards. One egg equals 1 oz.  2 servings of low-fat dairy each day.  A serving of nuts, seeds, or beans 5 times each week.  Heart-healthy fats. Healthy fats called Omega-3 fatty acids are found in foods such as flaxseeds and coldwater fish, like sardines, salmon, and mackerel.  Limit how much you eat of the following:  Canned or prepackaged foods.  Food that is high in trans fat, such as fried  foods.  Food that is high in saturated fat, such as fatty meat.  Sweets, desserts, sugary drinks, and other foods with added sugar.  Full-fat dairy products.  Do not salt foods before eating.  Try to eat at least 2 vegetarian meals each week.  Eat more home-cooked food and less restaurant, buffet, and fast food.  When eating at a restaurant, ask that your food be prepared with less salt or no salt, if possible. What foods are recommended? The items listed may not be a complete list. Talk with your dietitian about what dietary choices are best for you. Grains  Whole-grain or whole-wheat bread. Whole-grain or whole-wheat pasta.  Brown rice. [place] Morrow. Bulgur. Whole-grain and low-sodium cereals. Pita bread. Low-fat, low-sodium crackers. Whole-wheat flour tortillas. Vegetables  Fresh or frozen vegetables (raw, steamed, roasted, or grilled). Low-sodium or reduced-sodium tomato and vegetable juice. Low-sodium or reduced-sodium tomato sauce and tomato paste. Low-sodium or reduced-sodium canned vegetables. Fruits  All fresh, dried, or frozen fruit. Canned fruit in natural juice (without added sugar). Meat and other protein foods  Skinless chicken or [place]. Ground chicken or [place]. Pork with fat trimmed off. Fish and seafood. Egg whites. Dried beans, peas, or lentils. Unsalted nuts, nut butters, and seeds. Unsalted canned beans. Lean cuts of beef with fat trimmed off. Low-sodium, lean deli meat. Dairy  Low-fat (1%) or fat-free (skim) milk. Fat-free, low-fat, or reduced-fat cheeses. Nonfat, low-sodium ricotta or cottage cheese. Low-fat or nonfat yogurt. Low-fat, low-sodium cheese. Fats and oils  Soft margarine without trans fats. Vegetable oil. Low-fat, reduced-fat, or light mayonnaise and salad dressings (reduced-sodium). Canola, safflower, olive, soybean, and sunflower oils. Avocado. Seasoning and other foods  Herbs. Spices. Seasoning mixes without salt. Unsalted popcorn and pretzels. Fat-free sweets. What foods are not recommended? The items listed may not be a complete list. Talk with your dietitian about what dietary choices are best for you. Grains  Baked goods made with fat, such as croissants, muffins, or some breads. Dry pasta or rice meal packs. Vegetables  Creamed or fried vegetables. Vegetables in a cheese sauce. Regular canned vegetables (not low-sodium or reduced-sodium). Regular canned tomato sauce and paste (not low-sodium or reduced-sodium). Regular tomato and vegetable juice (not low-sodium or reduced-sodium). Angie Fava. Olives. Fruits  Canned fruit in a light or heavy syrup. Fried fruit. Fruit in cream  or butter sauce. Meat and other protein foods  Fatty cuts of meat. Ribs. Fried meat. Berniece Salines. Sausage. Bologna and other processed lunch meats. Salami. Fatback. Hotdogs. Bratwurst. Salted nuts and seeds. Canned beans with added salt. Canned or smoked fish. Whole eggs or egg yolks. Chicken or [place] with skin. Dairy  Whole or 2% milk, cream, and half-and-half. Whole or full-fat cream cheese. Whole-fat or sweetened yogurt. Full-fat cheese. Nondairy creamers. Whipped toppings. Processed cheese and cheese spreads. Fats and oils  Butter. Stick margarine. Lard. Shortening. Ghee. Bacon fat. Tropical oils, such as coconut, palm kernel, or palm oil. Seasoning and other foods  Salted popcorn and pretzels. Onion salt, garlic salt, seasoned salt, table salt, and sea salt. Worcestershire sauce. Tartar sauce. Barbecue sauce. Teriyaki sauce. Soy sauce, including reduced-sodium. Steak sauce. Canned and packaged gravies. Fish sauce. Oyster sauce. Cocktail sauce. Horseradish that you find on the shelf. Ketchup. Mustard. Meat flavorings and tenderizers. Bouillon cubes. Hot sauce and Tabasco sauce. Premade or packaged marinades. Premade or packaged taco seasonings. Relishes. Regular salad dressings. Where to find more information:  National Heart, Lung, and [place]: https://wilson-eaton.com/  American Heart Association: www.heart.org Summary  The  DASH eating plan is a healthy eating plan that has been shown to reduce high blood pressure (hypertension). It may also reduce your risk for type 2 diabetes, heart disease, and stroke.  With the DASH eating plan, you should limit salt (sodium) intake to 2,300 mg a day. If you have hypertension, you may need to reduce your sodium intake to 1,500 mg a day.  When on the DASH eating plan, aim to eat more fresh fruits and vegetables, whole grains, lean proteins, low-fat dairy, and heart-healthy fats.  Work with your health care provider or diet and nutrition specialist  (dietitian) to adjust your eating plan to your individual calorie needs. This information is not intended to replace advice given to you by your health care provider. Make sure you discuss any questions you have with your health care provider. Document Released: 12/01/2011 Document Revised: 12/05/2016 Document Reviewed: 12/05/2016 Elsevier Interactive Patient Education  2017 [employer].  IF you received an x-ray today, you will receive an invoice from [facility]. Please contact [facility] at [phone] with questions or concerns regarding your invoice.   IF you received labwork today, you will receive an invoice from [place]. Please contact [facility] at [phone] with questions or concerns regarding your invoice.   Our billing staff will not be able to assist you with questions regarding bills from these companies.  You will be contacted with the lab results as soon as they are available. The fastest way to get your results is to activate your My Chart account. Instructions are located on the last page of this paperwork. If you have not heard from [place] regarding the results in 2 weeks, please contact this office.

## 2017-02-21 NOTE — Progress Notes (Signed)
George Richardson  MRN: [id number] DOB: [birth year]  PCP: Magdalene River, PA-C  Subjective:  Pt is a 46 year old male PMH HTN who presents to clinic for medication refill.   H/o HTN: Uncontrolled. He has been out of his medications x 2 days. Today's blood pressure is 172/100. He inconsistently checks home Bps and they are 110/70.  Last OV increased Amlodipine to 10mg  qd.  Losartan 50mg  qd Amlodipine 10 mg qd Denies headache, lightheadedness, chest pain, palpitations, SOB.   Labs done 07/2016: CMP - Crea 1.46, otherwise wnl.  Blood pressure at last OV 07/2016 140/78.   He recently moved houses. He believes there was a mix-up in his medications during this move. His wife sets out his medications daily, he thinks she has been giving him the 5mg  Amlodipine instead of 10mg .   He is not eating a healthy diet. He and his wife have a difficult time cooking for two now, as their 2 kids just moved out. He is eating lots of fast food. He works at [employer] and "gets plenty of cardio" exercise.   He needs flu shot today.   Review of Systems  Respiratory: Negative for cough, chest tightness, shortness of breath and wheezing.   Cardiovascular: Negative for chest pain, palpitations and leg swelling.  Gastrointestinal: Negative for diarrhea, nausea and vomiting.  Neurological: Negative for dizziness, syncope, light-headedness and headaches.  Psychiatric/Behavioral: Negative for sleep disturbance. The patient is not nervous/anxious.     There are no active problems to display for this patient.   Current Outpatient Prescriptions on File Prior to Visit  Medication Sig Dispense Refill  . amLODipine (NORVASC) 10 MG tablet Take 1 tablet (10 mg total) by mouth daily. 90 tablet 0  . losartan (COZAAR) 50 MG tablet Take 1 tablet (50 mg total) by mouth daily. 90 tablet 0  . HYDROcodone-homatropine (HYCODAN) 5-1.5 MG/5ML syrup Take 5 mLs by mouth every 8 (eight) hours as needed for cough. (Patient not taking:  Reported on 02/21/2017) 120 mL 0  . traZODone (DESYREL) 50 MG tablet Take 0.5-1 tablets (25-50 mg total) by mouth at bedtime as needed for sleep. (Patient not taking: Reported on 07/28/2016) 30 tablet 3   No current facility-administered medications on file prior to visit.     No Known Allergies   Objective:  BP (!) 172/100   Pulse 62   Temp 98.4 F (36.9 C) (Oral)   Resp 16   Ht 5\' 7"  (1.702 m)   Wt 174 lb (78.9 kg)   SpO2 100%   BMI 27.25 kg/m   Physical Exam  Constitutional: He is oriented to person, place, and time and well-developed, well-nourished, and in no distress. No distress.  Cardiovascular: Normal rate, regular rhythm and normal heart sounds.   Neurological: He is alert and oriented to person, place, and time. GCS score is 15.  Skin: Skin is warm and dry.  Psychiatric: Mood, memory, affect and judgment normal.  Vitals reviewed.   Assessment and Plan :  1. Encounter for medication management 2. Essential hypertension 3. Elevated blood pressure reading - amLODipine (NORVASC) 10 MG tablet; Take 1 tablet (10 mg total) by mouth daily.  Dispense: 90 tablet; Refill: 0 - losartan (COZAAR) 50 MG tablet; Take 1 tablet (50 mg total) by mouth daily.  Dispense: 90 tablet; Refill: 0 - HTN is uncontrolled due to mediation error. He believes his wife has been accidentally giving him 5mg  Amlodipine instead of 10mg  qd. He has been out of medication  x 2 days. Discussed with pt need to better control his blood pressure. He understands and agrees. He will be sure to start taking 10mg  Amlodipine, as well as continue Losartan 50mg . DASH diet information printed out and discussed with pt. Encouraged regular exercise - other than cardio he gets at work. F/u in one month for recheck and labs.  Insurance covers 100% if Rx written for 90 pills.   4. Need for prophylactic vaccination and inoculation against influenza - Flu Vaccine QUAD 36+ mos IM  Marco CollieWhitney Gurleen Larrivee, PA-C  [facility] at  [facility] 02/21/2017 3:11 PM

## 2017-03-25 ENCOUNTER — Ambulatory Visit (INDEPENDENT_AMBULATORY_CARE_PROVIDER_SITE_OTHER): Payer: BLUE CROSS/BLUE SHIELD | Admitting: Family Medicine

## 2017-03-25 VITALS — BP 155/78 | HR 62 | Temp 98.2°F | Resp 18 | Ht 66.0 in | Wt 160.5 lb

## 2017-03-25 DIAGNOSIS — M545 Low back pain, unspecified: Secondary | ICD-10-CM

## 2017-03-25 MED ORDER — MELOXICAM 7.5 MG PO TABS
7.5000 mg | ORAL_TABLET | Freq: Every day | ORAL | 0 refills | Status: DC
Start: 2017-03-25 — End: 2017-09-22

## 2017-03-25 MED ORDER — TRAMADOL HCL 50 MG PO TABS: 50.0000 mg | ORAL_TABLET | Freq: Three times a day (TID) | ORAL | 1 refills | Status: DC | PRN

## 2017-03-25 MED ORDER — CYCLOBENZAPRINE HCL 5 MG PO TABS: 5.0000 mg | ORAL_TABLET | Freq: Three times a day (TID) | ORAL | 0 refills | Status: DC | PRN

## 2017-03-25 NOTE — Patient Instructions (Addendum)
I recommend taking the meloxicam every morning.  You can continue to use the tylenol/acetaminophen. Then when you come home, take a muscle relaxant followed by 15 minutes of heat followed by gentle stretching. The cyclobenzaprine makes most people sleepy and so you may only be able to take it before "bed" but if you tolerate it you may use it during the day as well. Try to do this regimen of heat and stretching 2 - 3 times a day. Then if you are still having pain after the meloxicam, acetaminophen, and cyclobenzaprine, then you can try a tramadol. If you are still having pain in 4 to 6 wks, come back to clinic for further eval. RTC immed if symptoms worsen or you develop any weakness or numbness in your leg, changes in your bowels or bladder, or pain traveling down your legs.     IF you received an x-ray today, you will receive an invoice from [facility]. Please contact [facility] at [phone] with questions or concerns regarding your invoice.   IF you received labwork today, you will receive an invoice from [place]. Please contact [facility] at [phone] with questions or concerns regarding your invoice.   Our billing staff will not be able to assist you with questions regarding bills from these companies.  You will be contacted with the lab results as soon as they are available. The fastest way to get your results is to activate your My Chart account. Instructions are located on the last page of this paperwork. If you have not heard from [place] regarding the results in 2 weeks, please contact this office.      Low Back Strain Rehab Ask your health care provider which exercises are safe for you. Do exercises exactly as told by your health care provider and adjust them as directed. It is normal to feel mild stretching, pulling, tightness, or discomfort as you do these exercises, but you should stop right away if you feel sudden pain or your pain gets worse. Do not begin these  exercises until told by your health care provider. Stretching and range of motion exercises These exercises warm up your muscles and joints and improve the movement and flexibility of your back. These exercises also help to relieve pain, numbness, and tingling. Exercise A: Single knee to chest   1. Lie on your back on a firm surface with both legs straight. 2. Bend one of your knees. Use your hands to move your knee up toward your chest until you feel a gentle stretch in your lower back and buttock.  Hold your leg in this position by holding onto the front of your knee.  Keep your other leg as straight as possible. 3. Hold for __________ seconds. 4. Slowly return to the starting position. 5. Repeat with your other leg. Repeat __________ times. Complete this exercise __________ times a day. Exercise B: Prone extension on elbows   1. Lie on your abdomen on a firm surface. 2. Prop yourself up on your elbows. 3. Use your arms to help lift your chest up until you feel a gentle stretch in your abdomen and your lower back.  This will place some of your body weight on your elbows. If this is uncomfortable, try stacking pillows under your chest.  Your hips should stay down, against the surface that you are lying on. Keep your hip and back muscles relaxed. 4. Hold for __________ seconds. 5. Slowly relax your upper body and return to the starting position. Repeat  __________ times. Complete this exercise __________ times a day. Strengthening exercises These exercises build strength and endurance in your back. Endurance is the ability to use your muscles for a long time, even after they get tired. Exercise C: Pelvic tilt  1. Lie on your back on a firm surface. Bend your knees and keep your feet flat. 2. Tense your abdominal muscles. Tip your pelvis up toward the ceiling and flatten your lower back into the floor.  To help with this exercise, you may place a small towel under your lower back and  try to push your back into the towel. 3. Hold for __________ seconds. 4. Let your muscles relax completely before you repeat this exercise. Repeat __________ times. Complete this exercise __________ times a day. Exercise D: Alternating arm and leg raises   1. Get on your hands and knees on a firm surface. If you are on a hard floor, you may want to use padding to cushion your knees, such as an exercise mat. 2. Line up your arms and legs. Your hands should be below your shoulders, and your knees should be below your hips. 3. Lift your left leg behind you. At the same time, raise your right arm and straighten it in front of you.  Do not lift your leg higher than your hip.  Do not lift your arm higher than your shoulder.  Keep your abdominal and back muscles tight.  Keep your hips facing the ground.  Do not arch your back.  Keep your balance carefully, and do not hold your breath. 4. Hold for __________ seconds. 5. Slowly return to the starting position and repeat with your right leg and your left arm. Repeat __________ times. Complete this exercise __________times a day. Exercise J: Single leg lower with bent knees  1. Lie on your back on a firm surface. 2. Tense your abdominal muscles and lift your feet off the floor, one foot at a time, so your knees and hips are bent in an "L" shape (at about 90 degrees).  Your knees should be over your hips and your lower legs should be parallel to the floor. 3. Keeping your abdominal muscles tense and your knee bent, slowly lower one of your legs so your toe touches the ground. 4. Lift your leg back up to return to the starting position.  Do not hold your breath.  Do not let your back arch. Keep your back flat against the ground. 5. Repeat with your other leg. Repeat __________ times. Complete this exercise __________ times a day. Posture and body mechanics   Body mechanics refers to the movements and positions of your body while you do your  daily activities. Posture is part of body mechanics. Good posture and healthy body mechanics can help to relieve stress in your body's tissues and joints. Good posture means that your spine is in its natural S-curve position (your spine is neutral), your shoulders are pulled back slightly, and your head is not tipped forward. The following are general guidelines for applying improved posture and body mechanics to your everyday activities. Standing    When standing, keep your spine neutral and your feet about hip-width apart. Keep a slight bend in your knees. Your ears, shoulders, and hips should line up.  When you do a task in which you stand in one place for a long time, place one foot up on a stable object that is 2-4 inches (5-10 cm) high, such as a footstool. This helps keep  your spine neutral. Sitting    When sitting, keep your spine neutral and keep your feet flat on the floor. Use a footrest, if necessary, and keep your thighs parallel to the floor. Avoid rounding your shoulders, and avoid tilting your head forward.  When working at a desk or a computer, keep your desk at a height where your hands are slightly lower than your elbows. Slide your chair under your desk so you are close enough to maintain good posture.  When working at a computer, place your monitor at a height where you are looking straight ahead and you do not have to tilt your head forward or downward to look at the screen. Resting    When lying down and resting, avoid positions that are most painful for you.  If you have pain with activities such as sitting, bending, stooping, or squatting (flexion-based activities), lie in a position in which your body does not bend very much. For example, avoid curling up on your side with your arms and knees near your chest (fetal position).  If you have pain with activities such as standing for a long time or reaching with your arms (extension-based activities), lie with your spine  in a neutral position and bend your knees slightly. Try the following positions:  Lying on your side with a pillow between your knees.  Lying on your back with a pillow under your knees. Lifting    When lifting objects, keep your feet at least shoulder-width apart and tighten your abdominal muscles.  Bend your knees and hips and keep your spine neutral. It is important to lift using the strength of your legs, not your back. Do not lock your knees straight out.  Always ask for help to lift heavy or awkward objects. This information is not intended to replace advice given to you by your health care provider. Make sure you discuss any questions you have with your health care provider. Document Released: 12/12/2005 Document Revised: 08/18/2016 Document Reviewed: 09/23/2015 Elsevier Interactive Patient Education  2017 [employer].

## 2017-03-25 NOTE — Progress Notes (Signed)
Called [facility]ol to [facility]

## 2017-03-25 NOTE — Progress Notes (Signed)
Subjective:  By signing my name below, I, Stann Ore, attest that this documentation has been prepared under the direction and in the presence of Norberto Sorenson, MD. Electronically Signed: Stann Ore, Scribe. 03/25/2017 , 12:24 PM .  Patient was seen in Room 1 .   Patient ID: George Richardson, male    DOB: July 05, 1971, 46 y.o.   MRN: [id number] Chief Complaint  Patient presents with  . Blood Pressure Check  . Back Injury    happened 1.5 weeks ago- happenend at work ([employer])   HPI George Richardson is a 46 y.o. male who presents to Primary Care at [facility] complaining of back injury which occurred about 10 days ago. He was seen 1 month prior for his blood pressure, which was uncontrolled due to being out of medication for 2 days. He reported his home BP were great at 110/70 on amlodipine , and losartan . He was instructed to follow up in 1 month for recheck BP and lab work.   Patient states his back pain started after getting off his couch and felt intermittent cramp across his lower back. He's felt some radiation into both buttocks. He denies pain radiating down his legs, though his legs become a little weak from time to time. He denies any numbness or tingling. He's been taking "Extra-strength tylenol" 6 tablets a day with some relief. He's also been applying heat over the effected area. He notes his pain is worse at the end of the day. He denies alcohol use.   Prior to his back pain, he started urinating more than usual with urgency. He states this had improved but still occurs about once a week. 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 He sleeps about 3 hours a day. He's been taking his BP medication daily. He hasn't been able to check his BP outside of the office.   Past Medical History:  Diagnosis Date  . Anemia   . Hypertension    Prior to Admission medications   Medication Sig Start Date End Date Taking? Authorizing Provider  amLODipine  (NORVASC) 10 MG tablet Take 1 tablet (10 mg total) by mouth daily. 02/21/17  Yes Elizabeth Whitney McVey, PA-C  losartan (COZAAR) 50 MG tablet Take 1 tablet (50 mg total) by mouth daily. 02/21/17  Yes Elizabeth Whitney McVey, PA-C   No Known Allergies  Review of Systems  Constitutional: Negative for fatigue and unexpected weight change.  Eyes: Negative for visual disturbance.  Respiratory: Negative for cough, chest tightness and shortness of breath.   Cardiovascular: Negative for chest pain, palpitations and leg swelling.  Gastrointestinal: Negative for abdominal pain and blood in stool.  Genitourinary: Positive for frequency and urgency.  Musculoskeletal: Positive for back pain.  Neurological: Positive for weakness. Negative for dizziness, light-headedness and headaches.       Objective:   Physical Exam  Constitutional: He is oriented to person, place, and time. He appears well-developed and well-nourished. No distress.  HENT:  Head: Normocephalic and atraumatic.  Eyes: EOM are normal. Pupils are equal, round, and reactive to light.  Neck: Neck supple.  Cardiovascular: Normal rate.   Pulmonary/Chest: Effort normal. No respiratory distress.  Musculoskeletal: Normal range of motion.  No tenderness over lumbar spinous process, or paraspinal muscles, no tenderness over SI joints, no tenderness over trochanter bursa  Neurological: He is alert and oriented to person, place, and time.  Reflex Scores:      Patellar reflexes are  3+ on the right side and 3+ on the left side.      Achilles reflexes are 3+ on the right side and 3+ on the left side. 5/5 strength testing without pain with hip flexor test, negative straight leg raise bilaterally though tight hamstrings, no sensation changes  Skin: Skin is warm and dry.  Psychiatric: He has a normal mood and affect. His behavior is normal.  Nursing note and vitals reviewed.   BP (!) 155/78   Pulse 62   Temp 98.2 F (36.8 C) (Oral)   Resp 18    Ht  (1.676 m)   Wt 160 lb 8 oz (72.8 kg)   SpO2 98%   BMI 25.91 kg/m     Assessment & Plan:   1. Acute bilateral low back pain without sciatica     Meds ordered this encounter  Medications  . cyclobenzaprine (FLEXERIL) 5 MG tablet    Sig: Take 1 tablet (5 mg total) by mouth 3 (three) times daily as needed for muscle spasms.    Dispense:  30 tablet    Refill:  0  . meloxicam (MOBIC) 7.5 MG tablet    Sig: Take 1 tablet (7.5 mg total) by mouth daily.    Dispense:  15 tablet    Refill:  0  . traMADol (ULTRAM) 50 MG tablet    Sig: Take 1 tablet (50 mg total) by mouth every 8 (eight) hours as needed.    Dispense:  30 tablet    Refill:  1    I personally performed the services described in this documentation, which was scribed in my presence. The recorded information has been reviewed and considered, and addended by me as needed.   Norberto Sorenson, M.D.  Primary Care at [facility] [address] [place], [place] [address] [phone] phone [phone] fax  04/23/17 9:33 PM

## 2017-04-13 ENCOUNTER — Ambulatory Visit (INDEPENDENT_AMBULATORY_CARE_PROVIDER_SITE_OTHER): Payer: BLUE CROSS/BLUE SHIELD

## 2017-04-13 ENCOUNTER — Ambulatory Visit (INDEPENDENT_AMBULATORY_CARE_PROVIDER_SITE_OTHER): Payer: BLUE CROSS/BLUE SHIELD | Admitting: Family Medicine

## 2017-04-13 VITALS — BP 154/75 | HR 68 | Temp 98.1°F | Resp 18 | Ht 67.0 in | Wt 164.8 lb

## 2017-04-13 DIAGNOSIS — M545 Low back pain: Secondary | ICD-10-CM

## 2017-04-13 DIAGNOSIS — R350 Frequency of micturition: Secondary | ICD-10-CM | POA: Diagnosis not present

## 2017-04-13 LAB — POCT URINALYSIS DIP (MANUAL ENTRY)
Bilirubin, UA: NEGATIVE
GLUCOSE UA: NEGATIVE mg/dL
Ketones, POC UA: NEGATIVE mg/dL
Leukocytes, UA: NEGATIVE
NITRITE UA: NEGATIVE
Spec Grav, UA: >=1.03 — AB (ref 1.010–1.025)
UROBILINOGEN UA: 1 U/dL
pH, UA: 6 (ref 5.0–8.0)

## 2017-04-13 MED ORDER — BACLOFEN 10 MG PO TABS: 10.0000 mg | ORAL_TABLET | Freq: Three times a day (TID) | ORAL | 1 refills | Status: DC

## 2017-04-13 MED ORDER — TRAMADOL HCL 50 MG PO TABS: 50.0000 mg | ORAL_TABLET | Freq: Three times a day (TID) | ORAL | 1 refills | Status: DC | PRN

## 2017-04-13 MED ORDER — KETOROLAC TROMETHAMINE 60 MG/2ML IM SOLN
60.0000 mg | Freq: Once | INTRAMUSCULAR | Status: AC
  Administered 2017-04-13: 60 mg via INTRAMUSCULAR

## 2017-04-13 NOTE — Progress Notes (Signed)
George Richardson is a 46 y.o. male who presents to Primary Care at [facility] today for back pain:  1.  Back Pain:  Patient recently seen here March 31 for same. Had been having pain x 2 weeks at that point.  Prescribed Meloxicam as anti-inflammatory and Tramadol for pain relief.  Also flexeril, which made him sleepy.    Since then -- only took Tramadol once with the Flexeril, which made him sleepy.  Has had continued pain.  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  No days off since last visit 3/31 as he works weekends.    Has had more pain in Right lower lumbar region.  Feels it is starting to "wrap around" to right flank.  This started about 3 days ago.  Describes as "burning" sensation.  No dysuria, frequency, hesitancy.  No fevers/chills.  No incontinence.  No history of kidney stones.  No family history of same.  Hasn't tried anything else for relief.  Difficulty sleeping due to pain.   ROS as above.    PMH reviewed. Patient is a nonsmoker.   Past Medical History:  Diagnosis Date  . Anemia   . Hypertension    No past surgical history on file.  Medications reviewed. Current Outpatient Prescriptions  Medication Sig Dispense Refill  . amLODipine (NORVASC) 10 MG tablet Take 1 tablet (10 mg total) by mouth daily. 90 tablet 0  . cyclobenzaprine (FLEXERIL) 5 MG tablet Take 1 tablet (5 mg total) by mouth 3 (three) times daily as needed for muscle spasms. 30 tablet 0  . losartan (COZAAR) 50 MG tablet Take 1 tablet (50 mg total) by mouth daily. 90 tablet 0  . meloxicam (MOBIC) 7.5 MG tablet Take 1 tablet (7.5 mg total) by mouth daily. 15 tablet 0  . traMADol (ULTRAM) 50 MG tablet Take 1 tablet (50 mg total) by mouth every 8 (eight) hours as needed. 30 tablet 1   No current facility-administered medications for this visit.      Physical Exam:  BP (!) 154/75   Pulse 68   Temp 98.1 F (36.7 C) (Oral)   Resp 18   Ht  (1.702 m)   Wt 164 lb 12.8 oz (74.8 kg)   SpO2  97%   BMI 25.81 kg/m  Gen:  Alert, cooperative patient who appears stated age in no acute distress.  Vital signs reviewed. HEENT: EOMI,  MMM Abd:  Soft/nondistended/nontender.  No real CVA tenderness.   Back:  Normal skin, slight scoliosis noted.  No tenderness to vertebral process palpation.  Paraspinous muscles are tender and with noticeably visible and palpable spasm Right lumbar region.  Range of motion is full at neck but decreased to only about 30 degrees forward flexion lumbar sacral regions limited by pain.  Straight leg raise is positive BL for Right sided back pain.   Neuro:  Sensation and motor function 5/5 bilateral lower extremities.  Patellar and Achilles  DTR's +2 patellar BL.  He is able to walk on his heels and toes without difficulty.  Skin:  No rash  Assessment and Plan:  1.  Lumbago:  - negative radiographs here except for scoliosis - likely muscle spasm.  Not fully treated due to concerns for sleepiness -- seems this was more from flexeril - switching to Baclofen.  Refill for Tramadol today.  Continue either mobic or Ibuprofen for anti-inflammatory effects.   - Heat and massage - I have written him off work until Monday  to help recover.  Home PT exercises given.   2.  Hematuria/proteinuria: - Has had prior proteinuria on U/A.   - KUB negative for stone.   - Possibility of stone causing pain.  Analgesia and hydration would be treatment.   - FU if pain worsening/no improvement with Baclofen/Tramadol.  If persists, recommend CT to assess for stone.    3.  HTN: - more likely cause of proteinuria - discussed he should FU in 4 - 6 weeks (once this acute issue has resolved) for repeat U/A to evaluate for persistent proteinuria.

## 2017-04-13 NOTE — Patient Instructions (Addendum)
It was very good to see you today.  You definitely have a bad muscle spasm in your lower back.  This is causing the pain you are feeling.    Take the Tramadol 50 - 100 mg every 8 hours or so for pain relief.    Take the Baclofen as a muscle relaxer.    Heat and massage are also great to help relieve the pain.  This can last for the next 7-10 days. If you're still having issues in the next 2 weeks come back and see [place].  If you start having worsening pain despite the treatment don't wait and come back immediately.  I don't see that you have anything like a kidney stone, but you do have some blood in your urine.  Come back in 4 - 6 weeks for a recheck of your urine.    IF you received an x-ray today, you will receive an invoice from [facility]. Please contact [facility] at [phone] with questions or concerns regarding your invoice.   IF you received labwork today, you will receive an invoice from [place]. Please contact [facility] at [phone] with questions or concerns regarding your invoice.   Our billing staff will not be able to assist you with questions regarding bills from these companies.  You will be contacted with the lab results as soon as they are available. The fastest way to get your results is to activate your My Chart account. Instructions are located on the last page of this paperwork. If you have not heard from [place] regarding the results in 2 weeks, please contact this office.

## 2017-08-24 ENCOUNTER — Other Ambulatory Visit: Payer: Self-pay | Admitting: Physician Assistant

## 2017-08-24 DIAGNOSIS — I10 Essential (primary) hypertension: Secondary | ICD-10-CM

## 2017-08-25 NOTE — Telephone Encounter (Signed)
SS refill req Amlodipine, Losartan Last seen 03/2017 in association with back injury Told to follow up in 4-6 wks for BP-has not returned. Sent 30 days Sent to scheduling to make appt asap

## 2017-09-21 ENCOUNTER — Encounter (HOSPITAL_COMMUNITY): Payer: Self-pay | Admitting: Emergency Medicine

## 2017-09-21 DIAGNOSIS — Z79899 Other long term (current) drug therapy: Secondary | ICD-10-CM | POA: Diagnosis not present

## 2017-09-21 DIAGNOSIS — R51 Headache: Secondary | ICD-10-CM | POA: Diagnosis not present

## 2017-09-21 DIAGNOSIS — I10 Essential (primary) hypertension: Secondary | ICD-10-CM | POA: Insufficient documentation

## 2017-09-21 MED ORDER — ONDANSETRON 4 MG PO TBDP
4.0000 mg | ORAL_TABLET | Freq: Once | ORAL | Status: AC | PRN
  Administered 2017-09-21: 4 mg via ORAL
  Filled 2017-09-21: qty 1

## 2017-09-21 MED ORDER — OXYCODONE-ACETAMINOPHEN 5-325 MG PO TABS
1.0000 | ORAL_TABLET | ORAL | Status: DC | PRN
  Administered 2017-09-21: 1 via ORAL
  Filled 2017-09-21: qty 1

## 2017-09-21 NOTE — ED Triage Notes (Signed)
Patient states he went to urgent care to make sure his headache was not from high blood pressure. Urgent care sent patient here to get further work up. Patient has had headache for about 5 days.

## 2017-09-22 ENCOUNTER — Encounter (HOSPITAL_COMMUNITY): Payer: Self-pay | Admitting: Radiology

## 2017-09-22 ENCOUNTER — Emergency Department (HOSPITAL_COMMUNITY)
Admission: EM | Admit: 2017-09-22 | Discharge: 2017-09-22 | Disposition: A | Discharge status: Home / Self Care | Payer: BLUE CROSS/BLUE SHIELD | Attending: Emergency Medicine | Admitting: Emergency Medicine

## 2017-09-22 ENCOUNTER — Emergency Department (HOSPITAL_COMMUNITY): Payer: BLUE CROSS/BLUE SHIELD

## 2017-09-22 DIAGNOSIS — R519 Headache, unspecified: Secondary | ICD-10-CM

## 2017-09-22 DIAGNOSIS — R51 Headache: Secondary | ICD-10-CM

## 2017-09-22 LAB — COMPREHENSIVE METABOLIC PANEL
ALBUMIN: 4.1 g/dL (ref 3.5–5.0)
ALK PHOS: 55 U/L (ref 38–126)
ALT: 18 U/L (ref 17–63)
AST: 22 U/L (ref 15–41)
Anion gap: 6 (ref 5–15)
BILIRUBIN TOTAL: 0.4 mg/dL (ref 0.3–1.2)
BUN: 11 mg/dL (ref 6–20)
CALCIUM: 9.1 mg/dL (ref 8.9–10.3)
CO2: 28 mmol/L (ref 22–32)
CREATININE: 1.15 mg/dL (ref 0.61–1.24)
Chloride: 106 mmol/L (ref 101–111)
GFR calc Af Amer: >60 mL/min (ref >60)
GLUCOSE: 107 mg/dL — AB (ref 65–99)
Potassium: 3.6 mmol/L (ref 3.5–5.1)
Sodium: 140 mmol/L (ref 135–145)
TOTAL PROTEIN: 7.3 g/dL (ref 6.5–8.1)

## 2017-09-22 LAB — URINALYSIS, ROUTINE W REFLEX MICROSCOPIC
BILIRUBIN URINE: NEGATIVE
Glucose, UA: NEGATIVE mg/dL
Hgb urine dipstick: NEGATIVE
KETONES UR: NEGATIVE mg/dL
LEUKOCYTES UA: NEGATIVE
NITRITE: NEGATIVE
PH: 5 (ref 5.0–8.0)
PROTEIN: NEGATIVE mg/dL
Specific Gravity, Urine: 1.02 (ref 1.005–1.030)

## 2017-09-22 LAB — CBC
HEMATOCRIT: 42.2 % (ref 39.0–52.0)
Hemoglobin: 14.7 g/dL (ref 13.0–17.0)
MCH: 29.9 pg (ref 26.0–34.0)
MCHC: 34.8 g/dL (ref 30.0–36.0)
MCV: 85.9 fL (ref 78.0–100.0)
PLATELETS: 197 10*3/uL (ref 150–400)
RBC: 4.91 MIL/uL (ref 4.22–5.81)
RDW: 13.7 % (ref 11.5–15.5)
WBC: 8.3 10*3/uL (ref 4.0–10.5)

## 2017-09-22 LAB — LIPASE, BLOOD: Lipase: 36 U/L (ref 11–51)

## 2017-09-22 MED ORDER — KETOROLAC TROMETHAMINE 30 MG/ML IJ SOLN
30.0000 mg | Freq: Once | INTRAMUSCULAR | Status: AC
  Administered 2017-09-22: 30 mg via INTRAVENOUS
  Filled 2017-09-22: qty 1

## 2017-09-22 MED ORDER — BUTALBITAL-APAP-CAFFEINE 50-325-40 MG PO TABS: 1.0000 | ORAL_TABLET | Freq: Four times a day (QID) | ORAL | 0 refills | Status: AC | PRN

## 2017-09-22 MED ORDER — METOCLOPRAMIDE HCL 5 MG/ML IJ SOLN
10.0000 mg | Freq: Once | INTRAMUSCULAR | Status: AC
  Administered 2017-09-22: 10 mg via INTRAVENOUS
  Filled 2017-09-22: qty 2

## 2017-09-22 MED ORDER — DIPHENHYDRAMINE HCL 50 MG/ML IJ SOLN
25.0000 mg | Freq: Once | INTRAMUSCULAR | Status: AC
  Administered 2017-09-22: 25 mg via INTRAVENOUS
  Filled 2017-09-22: qty 1

## 2017-09-22 NOTE — ED Notes (Signed)
Patient is alert and oriented x3.  He was given DC instructions and follow up visit instructions.  Patient gave verbal understanding.  He was DC ambulatory under his own power to home.  V/S stable.  He was not showing any signs of distress on DC 

## 2017-09-22 NOTE — ED Notes (Signed)
Patient states that he is feeling better and would like to go home 

## 2017-09-22 NOTE — ED Notes (Signed)
Patient is alert and oriented x4.  He is being seen for a headache that has been ongoing for 5 days.  Patient describes the pain as a sharp stabbing pain that radiates from his left neck to behind his eye.  Patient denies any Hx of migraines.

## 2017-09-22 NOTE — ED Provider Notes (Signed)
[facility]-EMERGENCY DEPT Provider Note   CSN: [id number] Arrival date & time: 09/21/17  2331     History   Chief Complaint Chief Complaint  Patient presents with  . Headache  . Hypertension    HPI George Richardson is a 46 y.o. male.  HPI   46 year old male with history of hypertension presenting for evaluation of headache. Patient reports gradual onset of persistent left-sided headache ongoing for the past 5 days. He describes headache as a vascular sensation starting on the left side headache radiates towards the back of his head with associated left arm weakness and pain. Headache is mild to moderate in severity but never fully resolved. Endorse slight blurred vision on the left eye. He denies any associated fever, recent cold symptoms, neck stiffness, diplopia, spots in vision, having chest pain, shortness of breath, abdominal pain, nausea vomiting diarrhea, or rash. She does not normally have headaches. States that he was diagnosed with high blood pressure in the past and has somewhat of a single episode of headache. He has been taking Tylenol at home with minimal relief. He was seen at urgent care yesterday and was told to come to the ER for further evaluation. Denies any history of IV drug use or active cancer. He works at [employer] and attributed his left arm pain and weakness due to increasing workload. He denies any light or sound sensitivity.  Past Medical History:  Diagnosis Date  . Anemia   . Hypertension     Patient Active Problem List   Diagnosis Date Noted  . Essential hypertension 02/21/2017    History reviewed. No pertinent surgical history.     Home Medications    Prior to Admission medications   Medication Sig Start Date End Date Taking? Authorizing Provider  amLODipine (NORVASC) 10 MG tablet TAKE 1 TABLET (10 MG TOTAL) BY MOUTH DAILY. 08/25/17   McVey, Madelaine Bhat, PA-C  baclofen (LIORESAL) 10 MG tablet Take 1 tablet (10 mg total) by mouth 3 (three) times  daily. 04/13/17   Tobey Grim, MD  cyclobenzaprine (FLEXERIL) 5 MG tablet Take 1 tablet (5 mg total) by mouth 3 (three) times daily as needed for muscle spasms. 03/25/17   Sherren Mocha, MD  losartan (COZAAR) 50 MG tablet TAKE 1 TABLET (50 MG TOTAL) BY MOUTH DAILY. 08/25/17   McVey, Madelaine Bhat, PA-C  meloxicam (MOBIC) 7.5 MG tablet Take 1 tablet (7.5 mg total) by mouth daily. 03/25/17   Sherren Mocha, MD  traMADol (ULTRAM) 50 MG tablet Take 1 tablet (50 mg total) by mouth every 8 (eight) hours as needed. 04/13/17   Tobey Grim, MD    Family History History reviewed. No pertinent family history.  Social History Social History  Substance Use Topics  . Smoking status: Never Smoker  . Smokeless tobacco: Never Used  . Alcohol use No     Allergies   Patient has no known allergies.   Review of Systems Review of Systems  All other systems reviewed and are negative.    Physical Exam Updated Vital Signs BP (!) 164/106 (BP Location: Right Arm)   Pulse 60   Temp 98.2 F (36.8 C) (Oral)   Resp 18   Ht  (1.676 m)   Wt 72.6 kg (160 lb)   SpO2 99%   BMI 25.82 kg/m   Physical Exam  Constitutional: He is oriented to person, place, and time. He appears well-developed and well-nourished. No distress.  HENT:  Head: Atraumatic.  Right Ear: External  ear normal.  Left Ear: External ear normal.  Mouth/Throat: Oropharynx is clear and moist.  Eyes: Pupils are equal, round, and reactive to light. Conjunctivae and EOM are normal.  Neck: Normal range of motion. Neck supple.  No nuchal rigidity  Cardiovascular: Normal rate and regular rhythm.   Pulmonary/Chest: Effort normal and breath sounds normal.  Abdominal: Soft. Bowel sounds are normal. He exhibits no distension. There is no tenderness.  Neurological: He is alert and oriented to person, place, and time.  Neurologic exam:  Speech clear, pupils equal round reactive to light, extraocular movements intact  Normal  peripheral visual fields Cranial nerves III through XII normal including no facial droop Follows commands, moves all extremities x4, normal strength to bilateral upper and lower extremities at all major muscle groups including grip Sensation normal to light touch Coordination intact, no limb ataxia, finger-nose-finger normal Rapid alternating movements normal No pronator drift Gait normal   Skin: No rash noted.  Psychiatric: He has a normal mood and affect.  Nursing note and vitals reviewed.    ED Treatments / Results  Labs (all labs ordered are listed, but only abnormal results are displayed) Labs Reviewed  COMPREHENSIVE METABOLIC PANEL - Abnormal; Notable for the following:       Result Value   Glucose, Bld 107 (*)    All other components within normal limits  LIPASE, BLOOD  CBC  URINALYSIS, ROUTINE W REFLEX MICROSCOPIC    EKG  EKG Interpretation None       Radiology Ct Head Wo Contrast  Result Date: 09/22/2017 CLINICAL DATA:  Headache and photophobia EXAM: CT HEAD WITHOUT CONTRAST TECHNIQUE: Contiguous axial images were obtained from the base of the skull through the vertex without intravenous contrast. COMPARISON:  None. FINDINGS: Brain: The ventricles are normal in size and configuration. There is no intracranial mass, hemorrhage, extra-axial fluid collection, or midline shift. Gray-white compartments are normal. No evident acute infarct. Vascular: No hyperdense vessel. There is no appreciable arterial vascular calcification. Skull: Bony calvarium appears intact. Sinuses/Orbits: Visualized paranasal sinuses are clear. There are concha bullosae on each side, an anatomic variant. Orbits appear symmetric bilaterally. Other: Mastoid air cells are clear. IMPRESSION: Study within normal limits. Electronically Signed   By: Bretta Bang III M.D.   On: 09/22/2017 07:53    Procedures Procedures (including critical care time)  Medications Ordered in ED Medications    oxyCODONE-acetaminophen (PERCOCET/ROXICET) 5-325 MG per tablet 1 tablet (1 tablet Oral Given 09/21/17 2359)  ondansetron (ZOFRAN-ODT) disintegrating tablet 4 mg (4 mg Oral Given 09/21/17 2359)     Initial Impression / Assessment and Plan / ED Course  I have reviewed the triage vital signs and the nursing notes.  Pertinent labs & imaging results that were available during my care of the patient were reviewed by me and considered in my medical decision making (see chart for details).     BP (!) 164/106 (BP Location: Right Arm)   Pulse 60   Temp 98.2 F (36.8 C) (Oral)   Resp 18   Ht  (1.676 m)   Wt 72.6 kg (160 lb)   SpO2 99%   BMI 25.82 kg/m  Patient noted to be hypertensive in the emergency department.  No signs of hypertensive urgency.  Discussed with patient the need for close follow-up and management by their primary care physician.    Final Clinical Impressions(s) / ED Diagnoses   Final diagnoses:  Bad headache    New Prescriptions New Prescriptions   BUTALBITAL-ACETAMINOPHEN-CAFFEINE (  FIORICET, ESGIC) 50-325-40 MG TABLET    Take 1-2 tablets by mouth every 6 (six) hours as needed for headache.   6:22 AM Patient here with persistent left-sided headache. No history of headache. Does report some left arm weakness. On exam, no appreciable weakness, no focal neuro deficit, patient is well-appearing with no nuchal rigidity. Vital signs stable, mild hypertension with a blood pressure of 164/106. Plan to provide migraine cocktail, and will also obtain CT scan for reassurance and to rule out space occupying lesion. Low suspicion for pseudotumor, meningitis, stroke, or other acute emergent medical condition. Doubt cardiac etiology  8:38 AM Labs are reassuring, head CT scan shows no acute abnormalities, patient felt much better after receiving a migraine cocktail and felt comfortable going home. I encouraged patient to follow-up with her primary care provider for further  evaluation and management of his health. Return precaution discussed. Fioricet prescribed   Fayrene Helper, PA-C 09/22/17 0841    Paula Libra, MD 09/26/17 [phone]

## 2017-10-26 ENCOUNTER — Other Ambulatory Visit: Payer: Self-pay | Admitting: Family Medicine

## 2017-11-20 ENCOUNTER — Other Ambulatory Visit: Payer: Self-pay | Admitting: Family Medicine

## 2018-01-31 ENCOUNTER — Other Ambulatory Visit: Payer: Self-pay | Admitting: Physician Assistant

## 2018-01-31 DIAGNOSIS — I10 Essential (primary) hypertension: Secondary | ICD-10-CM

## 2018-02-01 NOTE — Telephone Encounter (Signed)
Attempted to contact pt regarding refill request for amlodipine and losartan; no office visit since 02/21/17; recorded message states "voice mail is full"; unable to leave message at [phone]. Pt needs office visit for additional refills; 30 day supply given per protocol.

## 2018-03-04 ENCOUNTER — Other Ambulatory Visit: Payer: Self-pay | Admitting: Physician Assistant

## 2018-03-04 DIAGNOSIS — I10 Essential (primary) hypertension: Secondary | ICD-10-CM

## 2018-04-23 ENCOUNTER — Other Ambulatory Visit: Payer: Self-pay | Admitting: Physician Assistant

## 2018-04-23 DIAGNOSIS — I10 Essential (primary) hypertension: Secondary | ICD-10-CM

## 2018-05-11 ENCOUNTER — Other Ambulatory Visit: Payer: Self-pay | Admitting: Physician Assistant

## 2018-05-11 DIAGNOSIS — I10 Essential (primary) hypertension: Secondary | ICD-10-CM

## 2018-08-15 IMAGING — CT CT HEAD W/O CM
3 of 4 series · 14 of 47 positions shown, 16 images · non-contrast
Comparison: None.

CLINICAL DATA: Headache and photophobia

EXAM:
CT HEAD WITHOUT CONTRAST
TECHNIQUE: Contiguous axial images were obtained from the base of the skull
through the vertex without intravenous contrast.

[Series 2: head w/o · axial · non-contrast · 0.45mm/px · z∈[+1699,+1819]mm · 8 of 30 slices shown, 10 images]
[im 3/30  brain]
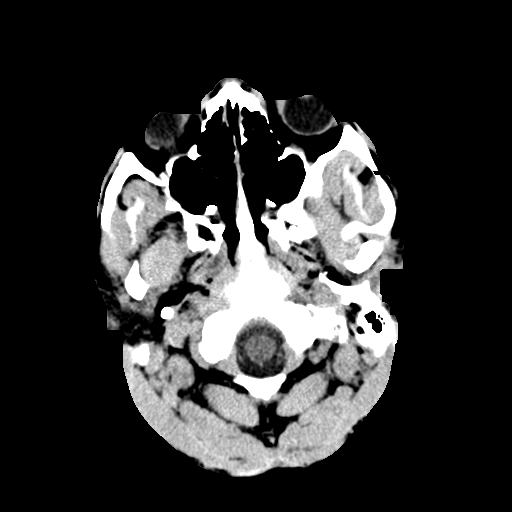
[im 3/30  bone]
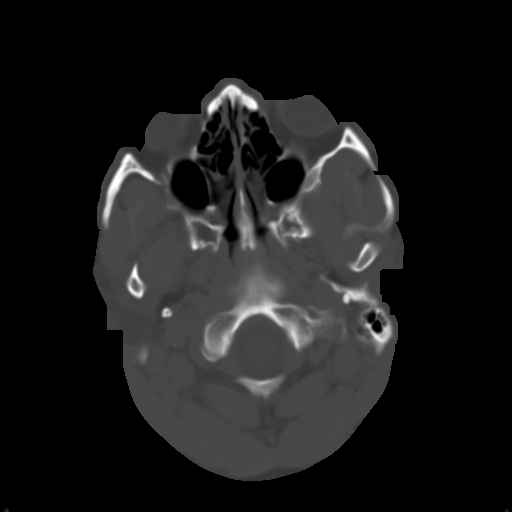
[im 7/30  brain]
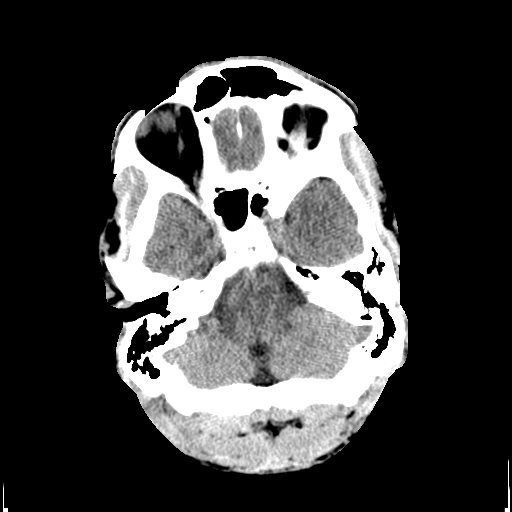
[im 11/30  brain]
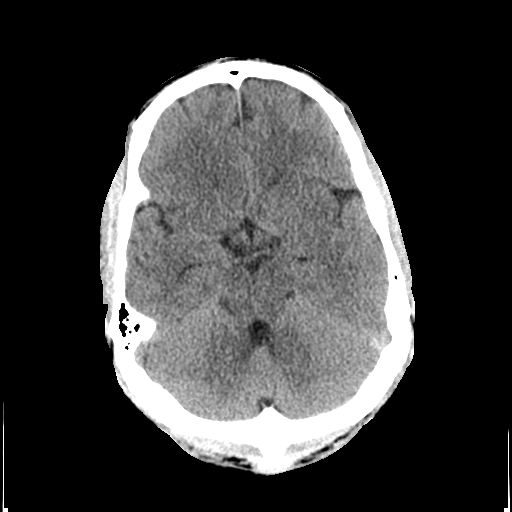
[im 13/30  brain]
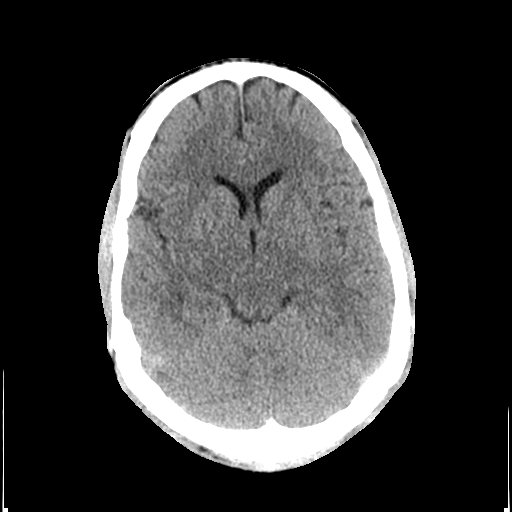
[im 17/30  brain]
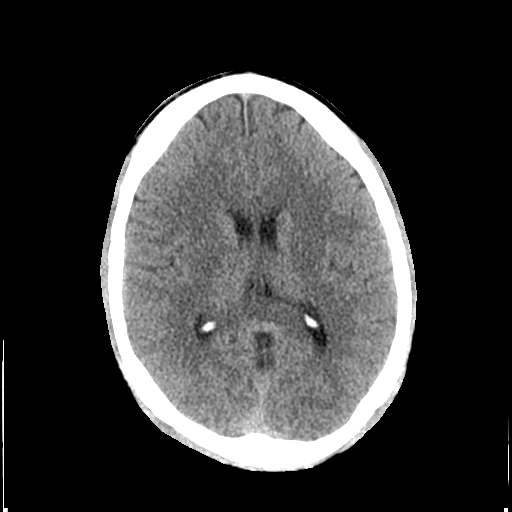
[im 17/30  bone]
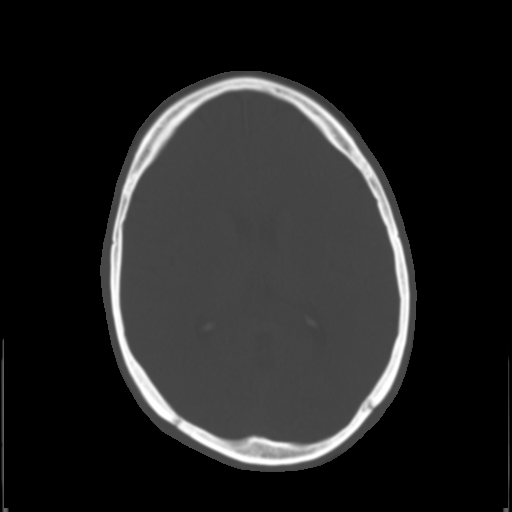
[im 19/30  brain]
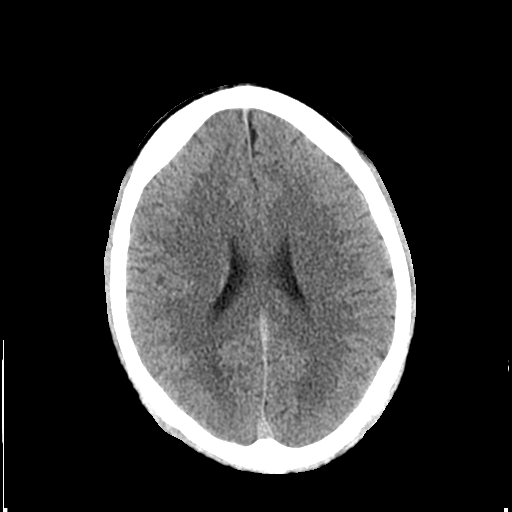
[im 23/30  brain]
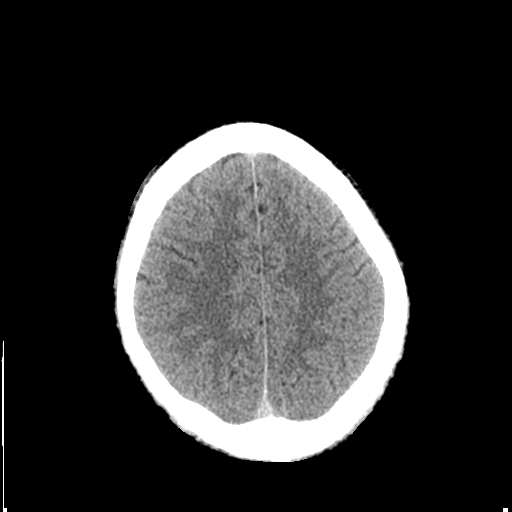
[im 27/30  brain]
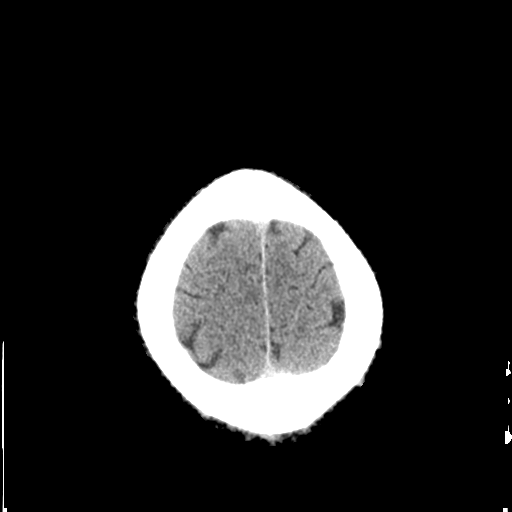

[Series 4: coronal · coronal · 0.32mm/px · 3 of 64 slices shown]
[im 22/64  brain]
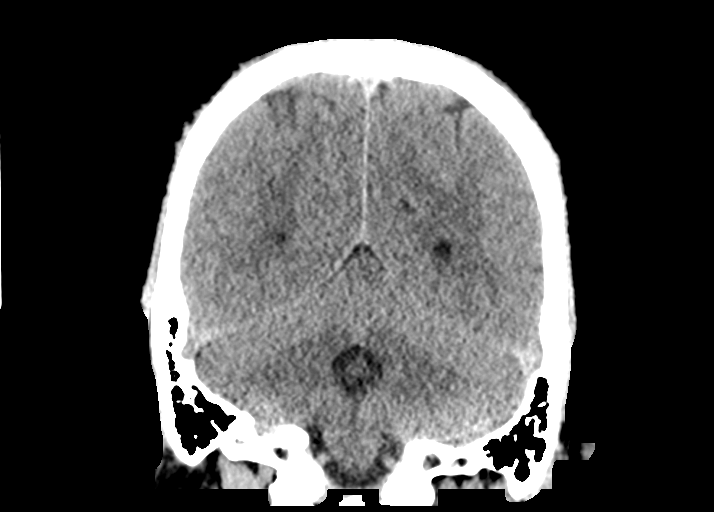
[im 29/64  brain]
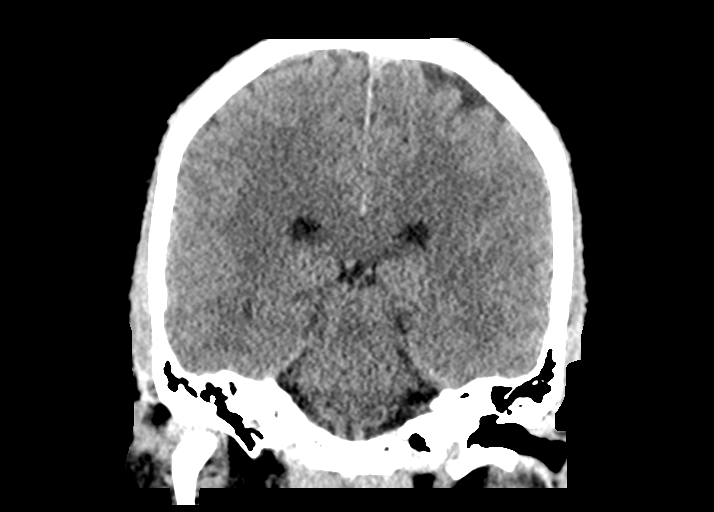
[im 36/64  brain]
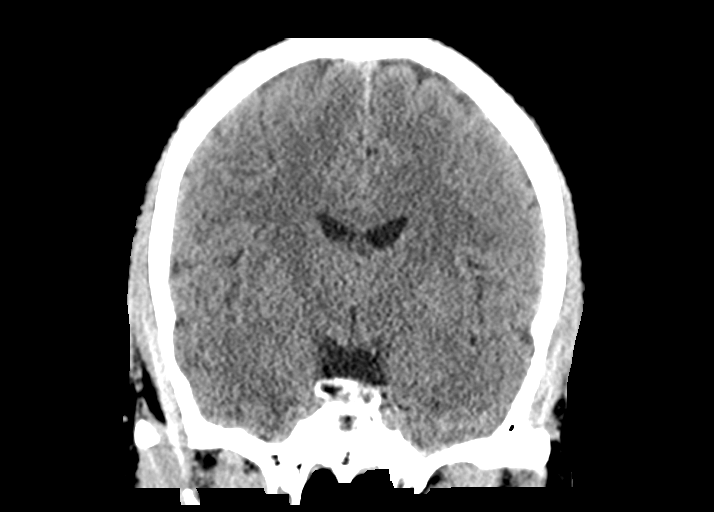

[Series 5: sagittal · sagittal · 0.30mm/px · 3 of 54 slices shown]
[im 18/54  brain]
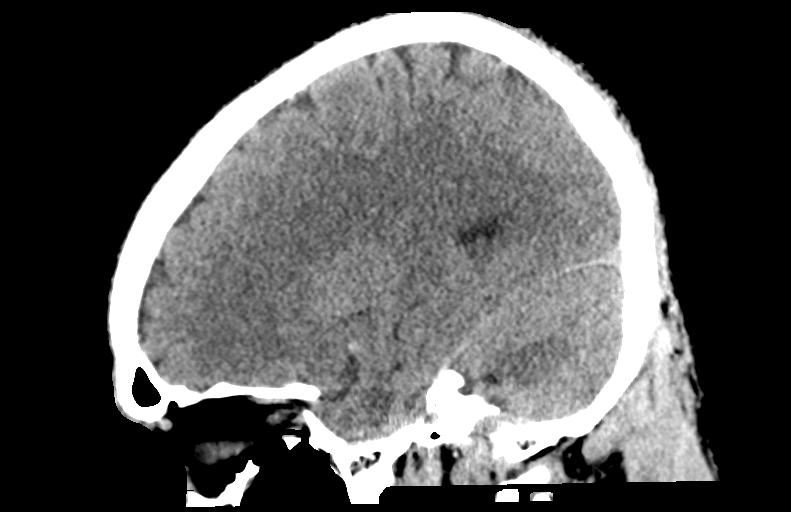
[im 27/54  brain]
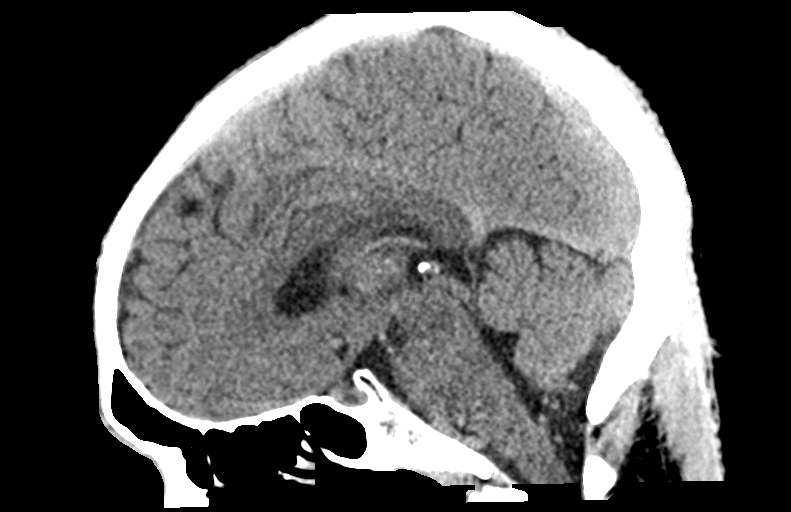
[im 36/54  brain]
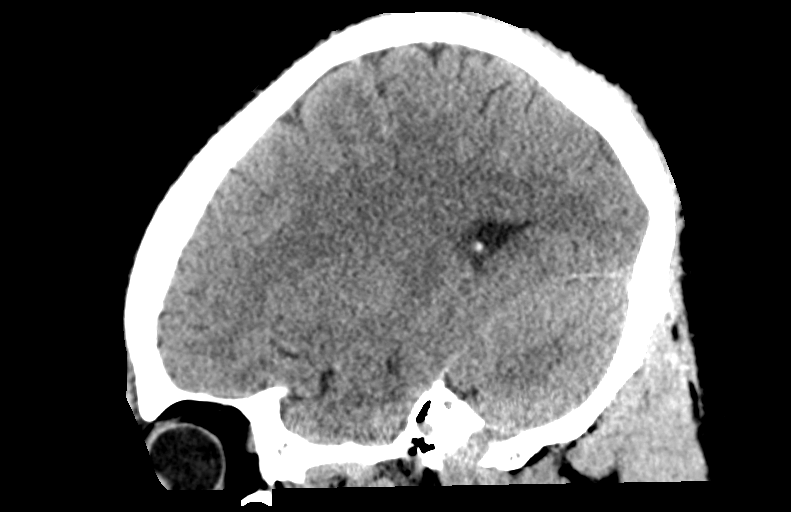

[14 of 47 positions shown; findings below may reference images not displayed]

FINDINGS: Brain: The ventricles are normal in size and configuration. There is
no intracranial mass, hemorrhage, extra-axial fluid collection, or
midline shift. Gray-white compartments are normal. No evident acute
infarct.

Vascular: No hyperdense vessel. There is no appreciable arterial
vascular calcification.

Skull: Bony calvarium appears intact.

Sinuses/Orbits: Visualized paranasal sinuses are clear. There are
concha bullosae on each side, an anatomic variant. Orbits appear
symmetric bilaterally.

Other: Mastoid air cells are clear.
IMPRESSION: Study within normal limits.

## 2019-05-07 ENCOUNTER — Other Ambulatory Visit: Payer: Self-pay

## 2019-05-07 DIAGNOSIS — I10 Essential (primary) hypertension: Secondary | ICD-10-CM

## 2019-05-07 DIAGNOSIS — Z299 Encounter for prophylactic measures, unspecified: Secondary | ICD-10-CM

## 2019-05-14 ENCOUNTER — Encounter: Payer: BLUE CROSS/BLUE SHIELD | Admitting: Family Medicine

## 2019-05-14 ENCOUNTER — Other Ambulatory Visit: Payer: Self-pay

## 2019-05-22 NOTE — Progress Notes (Signed)
This encounter was created in error - please disregard.

## 2023-07-17 ENCOUNTER — Other Ambulatory Visit: Payer: Self-pay | Admitting: Urology

## 2023-07-17 DIAGNOSIS — R972 Elevated prostate specific antigen [PSA]: Secondary | ICD-10-CM

## 2023-08-29 ENCOUNTER — Ambulatory Visit
Admission: RE | Admit: 2023-08-29 | Discharge: 2023-08-29 | Disposition: A | Discharge status: Home / Self Care | Payer: BC Managed Care – PPO | Source: Ambulatory Visit | Attending: Urology | Admitting: Urology

## 2023-08-29 DIAGNOSIS — R972 Elevated prostate specific antigen [PSA]: Secondary | ICD-10-CM

## 2023-08-29 MED ORDER — GADOPICLENOL 0.5 MMOL/ML IV SOLN
7.5000 mL | Freq: Once | INTRAVENOUS | Status: AC | PRN
  Administered 2023-08-29: 7.5 mL via INTRAVENOUS

## 2023-09-21 ENCOUNTER — Ambulatory Visit (INDEPENDENT_AMBULATORY_CARE_PROVIDER_SITE_OTHER): Payer: BC Managed Care – PPO | Admitting: Family

## 2023-09-21 ENCOUNTER — Encounter: Payer: Self-pay | Admitting: *Deleted

## 2023-09-21 ENCOUNTER — Encounter (HOSPITAL_BASED_OUTPATIENT_CLINIC_OR_DEPARTMENT_OTHER): Payer: Self-pay | Admitting: Family

## 2023-09-21 VITALS — BP 147/86 | HR 67 | Ht 66.0 in | Wt 159.2 lb

## 2023-09-21 DIAGNOSIS — Z006 Encounter for examination for normal comparison and control in clinical research program: Secondary | ICD-10-CM

## 2023-09-21 DIAGNOSIS — R0683 Snoring: Secondary | ICD-10-CM

## 2023-09-21 DIAGNOSIS — I10 Essential (primary) hypertension: Secondary | ICD-10-CM

## 2023-09-21 DIAGNOSIS — R7303 Prediabetes: Secondary | ICD-10-CM

## 2023-09-21 MED ORDER — SPIRONOLACTONE 25 MG PO TABS: 12.5000 mg | ORAL_TABLET | Freq: Every day | ORAL | 2 refills | Status: DC

## 2023-09-21 NOTE — Patient Instructions (Addendum)
Medication Instructions:  Your physician has recommended you make the following change in your medication:   Start: Spironolactone 12.5mg  daily    Labwork: Your physician recommends that you return for lab work same day as renal duplex (HOLD SPIRO 48 HOURS BEFORE) renin-aldosterone, catecholamines, and metanephrines    Testing/Procedures: Your physician has requested that you have a renal artery duplex. During this test, an ultrasound is used to evaluate blood flow to the kidneys. Allow one hour for this exam. Do not eat after midnight the day before and avoid carbonated beverages. Take your medications as you usually do.  Follow-Up: 11/21/2023 10:30 AM WITH PHARM D AT [facility] OFFICE   01/18/2024 10:30 AM WITH CAITLIN W NP    Special Instructions:  DASH Eating Plan DASH stands for Dietary Approaches to Stop Hypertension. The DASH eating plan is a healthy eating plan that has been shown to: Lower high blood pressure (hypertension). Reduce your risk for type 2 diabetes, heart disease, and stroke. Help with weight loss. What are tips for following this plan? Reading food labels Check food labels for the amount of salt (sodium) per serving. Choose foods with less than 5 percent of the Daily Value (DV) of sodium. In general, foods with less than 300 milligrams (mg) of sodium per serving fit into this eating plan. To find whole grains, look for the word "whole" as the first word in the ingredient list. Shopping Buy products labeled as "low-sodium" or "no salt added." Buy fresh foods. Avoid canned foods and pre-made or frozen meals. Cooking Try not to add salt when you cook. Use salt-free seasonings or herbs instead of table salt or sea salt. Check with your health care provider or pharmacist before using salt substitutes. Do not fry foods. Cook foods in healthy ways, such as baking, boiling, grilling, roasting, or broiling. Cook using oils that are good for your heart. These include olive,  canola, avocado, soybean, and sunflower oil. Meal planning  Eat a balanced diet. This should include: 4 or more servings of fruits and 4 or more servings of vegetables each day. Try to fill half of your plate with fruits and vegetables. 6-8 servings of whole grains each day. 6 or less servings of lean meat, poultry, or fish each day. 1 oz is 1 serving. A 3 oz (85 g) serving of meat is about the same size as the palm of your hand. One egg is 1 oz (28 g). 2-3 servings of low-fat dairy each day. One serving is 1 cup (237 mL). 1 serving of nuts, seeds, or beans 5 times each week. 2-3 servings of heart-healthy fats. Healthy fats called omega-3 fatty acids are found in foods such as walnuts, flaxseeds, fortified milks, and eggs. These fats are also found in cold-water fish, such as sardines, salmon, and mackerel. Limit how much you eat of: Canned or prepackaged foods. Food that is high in trans fat, such as fried foods. Food that is high in saturated fat, such as fatty meat. Desserts and other sweets, sugary drinks, and other foods with added sugar. Full-fat dairy products. Do not salt foods before eating. Do not eat more than 4 egg yolks a week. Try to eat at least 2 vegetarian meals a week. Eat more home-cooked food and less restaurant, buffet, and fast food. Lifestyle When eating at a restaurant, ask if your food can be made with less salt or no salt. If you drink alcohol: Limit how much you have to: 0-1 drink a day if you  are male. 0-2 drinks a day if you are male. Know how much alcohol is in your drink. In the [place] one drink is one 12 oz bottle of beer (355 mL), one 5 oz glass of wine (148 mL), or one 1 oz glass of hard liquor (44 mL). General information Avoid eating more than 2,300 mg of salt a day. If you have hypertension, you may need to reduce your sodium intake to 1,500 mg a day. Work with your provider to stay at a healthy body weight or lose weight. Ask what the best weight  range is for you. On most days of the week, get at least 30 minutes of exercise that causes your heart to beat faster. This may include walking, swimming, or biking. Work with your provider or dietitian to adjust your eating plan to meet your specific calorie needs. What foods should I eat? Fruits All fresh, dried, or frozen fruit. Canned fruits that are in their natural juice and do not have sugar added to them. Vegetables Fresh or frozen vegetables that are raw, steamed, roasted, or grilled. Low-sodium or reduced-sodium tomato and vegetable juice. Low-sodium or reduced-sodium tomato sauce and tomato paste. Low-sodium or reduced-sodium canned vegetables. Grains Whole-grain or whole-wheat bread. Whole-grain or whole-wheat pasta. Brown rice. Orpah Cobb. Bulgur. Whole-grain and low-sodium cereals. Pita bread. Low-fat, low-sodium crackers. Whole-wheat flour tortillas. Meats and other proteins Skinless chicken or [place]. Ground chicken or [place]. Pork with fat trimmed off. Fish and seafood. Egg whites. Dried beans, peas, or lentils. Unsalted nuts, nut butters, and seeds. Unsalted canned beans. Lean cuts of beef with fat trimmed off. Low-sodium, lean precooked or cured meat, such as sausages or meat loaves. Dairy Low-fat (1%) or fat-free (skim) milk. Reduced-fat, low-fat, or fat-free cheeses. Nonfat, low-sodium ricotta or cottage cheese. Low-fat or nonfat yogurt. Low-fat, low-sodium cheese. Fats and oils Soft margarine without trans fats. Vegetable oil. Reduced-fat, low-fat, or light mayonnaise and salad dressings (reduced-sodium). Canola, safflower, olive, avocado, soybean, and sunflower oils. Avocado. Seasonings and condiments Herbs. Spices. Seasoning mixes without salt. Other foods Unsalted popcorn and pretzels. Fat-free sweets. The items listed above may not be all the foods and drinks you can have. Talk to a dietitian to learn more. What foods should I avoid? Fruits Canned fruit in a  light or heavy syrup. Fried fruit. Fruit in cream or butter sauce. Vegetables Creamed or fried vegetables. Vegetables in a cheese sauce. Regular canned vegetables that are not marked as low-sodium or reduced-sodium. Regular canned tomato sauce and paste that are not marked as low-sodium or reduced-sodium. Regular tomato and vegetable juices that are not marked as low-sodium or reduced-sodium. Rosita Fire. Olives. Grains Baked goods made with fat, such as croissants, muffins, or some breads. Dry pasta or rice meal packs. Meats and other proteins Fatty cuts of meat. Ribs. Fried meat. Tomasa Blase. Bologna, salami, and other precooked or cured meats, such as sausages or meat loaves, that are not lean and low in sodium. Fat from the back of a pig (fatback). Bratwurst. Salted nuts and seeds. Canned beans with added salt. Canned or smoked fish. Whole eggs or egg yolks. Chicken or [place] with skin. Dairy Whole or 2% milk, cream, and half-and-half. Whole or full-fat cream cheese. Whole-fat or sweetened yogurt. Full-fat cheese. Nondairy creamers. Whipped toppings. Processed cheese and cheese spreads. Fats and oils Butter. Stick margarine. Lard. Shortening. Ghee. Bacon fat. Tropical oils, such as coconut, palm kernel, or palm oil. Seasonings and condiments Onion salt, garlic salt, seasoned salt, table salt, and  sea salt. Worcestershire sauce. Tartar sauce. Barbecue sauce. Teriyaki sauce. Soy sauce, including reduced-sodium soy sauce. Steak sauce. Canned and packaged gravies. Fish sauce. Oyster sauce. Cocktail sauce. Store-bought horseradish. Ketchup. Mustard. Meat flavorings and tenderizers. Bouillon cubes. Hot sauces. Pre-made or packaged marinades. Pre-made or packaged taco seasonings. Relishes. Regular salad dressings. Other foods Salted popcorn and pretzels. The items listed above may not be all the foods and drinks you should avoid. Talk to a dietitian to learn more. Where to find more information National Heart,  Lung, and Blood Institute (NHLBI): BuffaloDryCleaner.gl American Heart Association (AHA): heart.org Academy of Nutrition and Dietetics: eatright.org National Kidney Foundation (NKF): kidney.org This information is not intended to replace advice given to you by your health care provider. Make sure you discuss any questions you have with your health care provider. Document Revised: 12/29/2022 Document Reviewed: 12/29/2022 Elsevier Patient Education  2024 Elsevier Inc.  [place]?  Is a FDA cleared portable home sleep study test that uses a watch and 3 points of contact to monitor 7 different channels, including your heart rate, oxygen saturations, body position, snoring, and chest motion.  The study is easy to use from the comfort of your own home and accurately detect sleep apnea.  Before bed, you attach the chest sensor, attached the sleep apnea bracelet to your nondominant hand, and attach the finger probe.  After the study, the raw data is downloaded from the watch and scored for apnea events.   For more information: https://www.itamar-medical.com/patients/  Patient Testing Instructions:  Do not put battery into the device until bedtime when you are ready to begin the test. Please call the support number if you need assistance after following the instructions below: 24 hour support line- [phone] or [facility] support at [phone] (option 2)  Download the IntelWatchPAT One" app through the google play store or App Store  Be sure to turn on or enable access to bluetooth in settlings on your smartphone/ device  Make sure no other bluetooth devices are on and within the vicinity of your smartphone/ device and WatchPAT watch during testing.  Make sure to leave your smart phone/ device plugged in and charging all night.  When ready for bed:  Follow the instructions step by step in the WatchPAT One App to activate the testing device. For additional instructions, including video instruction, visit the  WatchPAT One video on Youtube. You can search for WatchPat One within Youtube (video is 4 minutes and 18 seconds) or enter: https://youtube/watch?v=BCce_vbiwxE Please note: You will be prompted to enter a Pin to connect via bluetooth when starting the test. The PIN will be assigned to you when you receive the test.  The device is disposable, but it recommended that you retain the device until you receive a call letting you know the study has been received and the results have been interpreted.  We will let you know if the study did not transmit to [place] properly after the test is completed. You do not need to call us to confirm the receipt of the test.  Please complete the test within 48 hours of receiving PIN.   Frequently Asked Questions:  What is Watch Dennie Bible one?  A single use fully disposable home sleep apnea testing device and will not need to be returned after completion.  What are the requirements to use WatchPAT one?  The be able to have a successful watchpat one sleep study, you should have your Watch pat one device, your smart phone, watch pat one app,  your PIN number and Internet access What type of phone do I need?  You should have a smart phone that uses Android 5.1 and above or any Iphone with IOS 10 and above How can I download the WatchPAT one app?  Based on your device type search for WatchPAT one app either in google play for android devices or APP store for Iphone's Where will I get my PIN for the study?  Your PIN will be provided by your physician's office. It is used for authentication and if you lose/forget your PIN, please reach out to your providers office.  I do not have Internet at home. Can I do WatchPAT one study?  WatchPAT One needs Internet connection throughout the night to be able to transmit the sleep data. You can use your home/local internet or your cellular's data package. However, it is always recommended to use home/local Internet. It is estimated that between  20MB-30MB will be used with each study.However, the application will be looking for space in the phone to start the study.  What happens if I lose internet or bluetooth connection?  During the internet disconnection, your phone will not be able to transmit the sleep data. All the data, will be stored in your phone. As soon as the internet connection is back on, the phone will being sending the sleep data. During the bluetooth disconnection, WatchPAT one will not be able to to send the sleep data to your phone. Data will be kept in the [facility] one until two devices have bluetooth connection back on. As soon as the connection is back on, WatchPAT one will send the sleep data to the phone.  How long do I need to wear the WatchPAT one?  After you start the study, you should wear the device at least 6 hours.  How far should I keep my phone from the device?  During the night, your phone should be within 15 feet.  What happens if I leave the room for restroom or other reasons?  Leaving the room for any reason will not cause any problem. As soon as your get back to the room, both devices will reconnect and will continue to send the sleep data. Can I use my phone during the sleep study?  Yes, you can use your phone as usual during the study. But it is recommended to put your watchpat one on when you are ready to go to bed.  How will I get my study results?  A soon as you completed your study, your sleep data will be sent to the provider. They will then share the results with you when they are ready.

## 2023-09-21 NOTE — Progress Notes (Signed)
Advanced Hypertension Clinic Initial Assessment:    Date:  09/21/2023   ID:  George Richardson, DOB 1949/01/02, MRN [id number]  PCP:  Norm Salt, PA  Cardiologist:  None  Nephrologist:  Referring MD: Norm Salt, PA   CC: Hypertension  History of Present Illness:    George Richardson is a 52 y.o. male with a hx of  hypertension, prediabetes, here to establish care in the Advanced Hypertension Clinic.   George Richardson was diagnosed with hypertension 8 years ago around age 52 years old. It became increasingly difficult to control 2 years ago. Now working 2jobs ([employer] overnight and second shift [employer]). Blood pressure checked with wrist cuff at home which has not been checked for accuracy. PCP recently transitioned from Amlodipine and Losartan to Amlodipine-Olmesartan 5-40mg  daily. Does note that he had interruption in antihypertensive therapy as he thought BP was controlled but then notes it was markedly elevated SBP 190s off his medications. Now taking Amlodipine-Olmesartan consistently.  he reports tobacco use never. Alcohol use socially rarely. 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 he eats at home and does not follow low sodium diet. Does eat a decent amount of prepackaged meals. Snores and often wakes feeling tired.   06/05/23 TSH 2.57, A1c 5.8, creatinine 1.25, GFR 70, AST 16, ALT 15, total cholesterol 154, triglycerides 66, HDL 54, LDL 85, K 4.0  Previous antihypertensives: Amlodipine - swelling on 10mg  dose, tolerates 5mg  Losartan Lisinopril - cough, kidney issues Hydrochlorothiazide - urinary frequency   Past Medical History:  Diagnosis Date   Anemia    Hypertension    Prediabetes     No past surgical history on file.  Current Medications: Current Meds  Medication Sig   acetaminophen (TYLENOL) 500 MG tablet Take 1,000 mg by mouth every 6 (six) hours as needed for headache.    amLODipine-olmesartan (AZOR) 5-40 MG tablet Take 1 tablet by mouth daily.   spironolactone (ALDACTONE) 25 MG tablet Take 0.5 tablets (12.5 mg total) by mouth daily.   [DISCONTINUED] amLODipine (NORVASC) 10 MG tablet TAKE 1 TABLET (10 MG TOTAL) BY MOUTH DAILY.   [DISCONTINUED] losartan (COZAAR) 50 MG tablet TAKE 1 TABLET (50 MG TOTAL) BY MOUTH DAILY.     Allergies:   Patient has no known allergies.   Social History   Socioeconomic History   Marital status: Married    Spouse name: Not on file   Number of children: Not on file   Years of education: Not on file   Highest education level: Not on file  Occupational History   Not on file  Tobacco Use   Smoking status: Never   Smokeless tobacco: Never  Substance and Sexual Activity   Alcohol use: No   Drug use: No   Sexual activity: Yes    Partners: Female    Birth control/protection: Condom  Other Topics Concern   Not on file  Social History Narrative   Not on file   Social Determinants of Health   Financial Resource Strain: Not on file  Food Insecurity: Not on file  Transportation Needs: Not on file  Physical Activity: Not on file  Stress: Not on file  Social Connections: Not on file     Family History: The patient's family history includes Anemia in his mother; Cancer in his mother; Hypertension in his maternal grandmother.  ROS:   Please see the history of present illness.  All other systems reviewed and are negative.  EKGs/Labs/Other Studies Reviewed:    EKG Interpretation Date/Time:  Thursday September 21 2023 11:30:13 EDT Ventricular Rate:  60 PR Interval:  136 QRS Duration:  92 QT Interval:  376 QTC Calculation: 376 R Axis:   50  Text Interpretation: Normal sinus rhythm  LVH repolarization.  TWI lead III. Confirmed by Gillian Shields ([id number]) on 09/21/2023 1:16:17 PM   Recent Labs: No results found for requested labs within last 365 days.   Recent Lipid Panel    Component Value Date/Time   CHOL 149  02/14/2015 1431   TRIG 61 02/14/2015 1431   HDL 42 02/14/2015 1431   CHOLHDL 3.5 02/14/2015 1431   VLDL 12 02/14/2015 1431   LDLCALC 95 02/14/2015 1431    Physical Exam:   VS:  BP (!) 147/86 Comment: right arm  Pulse 67   Ht 5\' 6"  (1.676 m)   Wt 159 lb 3.2 oz (72.2 kg)   SpO2 96%   BMI 25.70 kg/m  , BMI Body mass index is 25.7 kg/m. GENERAL:  Well appearing HEENT: Pupils equal round and reactive, fundi not visualized, oral mucosa unremarkable NECK:  No jugular venous distention, waveform within normal limits, carotid upstroke brisk and symmetric, no bruits, no thyromegaly LYMPHATICS:  No cervical adenopathy LUNGS:  Clear to auscultation bilaterally HEART:  RRR.  PMI not displaced or sustained,S1 and S2 within normal limits, no S3, no S4, no clicks, no rubs, no murmurs ABD:  Flat, positive bowel sounds normal in frequency in pitch, no bruits, no rebound, no guarding, no midline pulsatile mass, no hepatomegaly, no splenomegaly EXT:  2 plus pulses throughout, no edema, no cyanosis no clubbing SKIN:  No rashes no nodules NEURO:  Cranial nerves II through XII grossly intact, motor grossly intact throughout PSYCH:  Cognitively intact, oriented to person place and time   ASSESSMENT/PLAN:    HTN - BP not at goal <130/80. Continue Amlodipine-Olmesartan 5-40mg  daily. Prior edema with higher dose Amlodipine. Prior urinary frequency with hydrochlorothiazide. Start spironolactone 12.5 mg daily. Plan for renal duplex to rule out stenosis. Sleep study, as below. Plan for renin aldosterone, catecholamines, metanephrines assess for secondary hypertension.  Hold spironolactone 48 hours prior to renal aldosterone lab collection. Enrolled in [employer] RPM study. Recommend aiming for 150 minutes of moderate intensity activity per week and following a heart healthy diet.    Snores - Notes snoring, daytime somnolence. Itamar home sleep study provided  Prediabetes - Continue to follow with PCP.    Screening for Secondary Hypertension:     09/21/2023    1:19 PM  Causes  Renovascular HTN Screened     - Comments 07/2023 renal duplex ordered  Thyroid Disease Screened     - Comments 05/2023 normal TSH  Hyperaldosteronism Screened     - Comments 07/2023 renin aldosterone ordered  Pheochromocytoma Screened     - Comments 07/2023 catecholamines, metanephrines ordered  Cushing's Syndrome N/A     - Comments non cushingoid appearance  Coarctation of the Aorta N/A     - Comments BP symmetrical  Compliance Screened    Relevant Labs/Studies:    Latest Ref Rng & Units 09/22/2017    1:04 AM 07/28/2016   12:51 PM 01/01/2016    4:55 PM  Basic Labs  Sodium 135 - 145 mmol/L 140  141  139   Potassium 3.5 - 5.1 mmol/L 3.6  3.9  4.4   Creatinine 0.61 - 1.24 mg/dL 1.88  4.16  6.06  Latest Ref Rng & Units 01/01/2016    4:55 PM  Thyroid   TSH 0.350 - 4.500 uIU/mL 2.010                 09/21/2023   12:13 PM  Renovascular   Renal Artery [place] Completed Yes        he consents to be monitored in our remote patient monitoring program through [employer].  he will track his blood pressure twice daily and understands that these trends will help [place] to adjust his medications as needed prior to his next appointment.    Disposition:    FU with MD/PharmD in 2 months    Medication Adjustments/Labs and Tests Ordered: Current medicines are reviewed at length with the patient today.  Concerns regarding medicines are outlined above.  Orders Placed This Encounter  Procedures   Catecholamines, fractionated, plasma   Metanephrines, plasma   Aldosterone + renin activity w/ ratio   Cantril's Ladder Assessment   EKG 12-Lead   Itamar Sleep Study   VAS US RENAL ARTERY DUPLEX   Meds ordered this encounter  Medications   spironolactone (ALDACTONE) 25 MG tablet    Sig: Take 0.5 tablets (12.5 mg total) by mouth daily.    Dispense:  15 tablet    Refill:  2    Order Specific Question:   Supervising  Provider    Answer:   Jodelle Red [[id number]]     Signed, Alver Sorrow, NP  09/21/2023 1:21 PM    [facility]

## 2023-09-21 NOTE — Research (Signed)
  Subject Name: George Richardson met inclusion and exclusion criteria for the Virtual Care and Social Determinant Interventions for the management of hypertension trial.  The informed consent form, study requirements and expectations were reviewed with the subject by Dr. Duke Salvia and myself. The subject was given the opportunity to read the consent and ask questions. The subject verbalized understanding of the trial requirements.  All questions were addressed prior to the signing of the consent form. The subject agreed to participate in the trial and signed the informed consent. The informed consent was obtained prior to performance of any protocol-specific procedures for the subject.  A copy of the signed informed consent was given to the subject and a copy was placed in the subject's medical record.  George Richardson was randomized to Group 2.

## 2023-09-22 ENCOUNTER — Telehealth: Payer: Self-pay

## 2023-09-22 DIAGNOSIS — Z0000 Encounter for general adult medical examination without abnormal findings: Secondary | ICD-10-CM

## 2023-09-22 NOTE — Telephone Encounter (Signed)
Called patient to conduct welcome call and offer health coaching per survey response. Patient did not answer. Left message for patient to return call.     Renaee Munda, MS, ERHD, [facility]  Care Guide, Health & Wellness Coach [address], [address] [place] [address] Telephone: [phone] Email: [email or web address]

## 2023-09-27 ENCOUNTER — Ambulatory Visit (HOSPITAL_COMMUNITY)
Admission: RE | Admit: 2023-09-27 | Discharge: 2023-09-27 | Disposition: A | Discharge status: Home / Self Care | Payer: BC Managed Care – PPO | Source: Ambulatory Visit | Attending: Cardiology | Admitting: Cardiology

## 2023-09-27 DIAGNOSIS — I10 Essential (primary) hypertension: Secondary | ICD-10-CM | POA: Insufficient documentation

## 2023-10-03 ENCOUNTER — Telehealth (HOSPITAL_BASED_OUTPATIENT_CLINIC_OR_DEPARTMENT_OTHER): Payer: Self-pay

## 2023-10-03 NOTE — Telephone Encounter (Signed)
Patient George Richardson ordered 9/26, please advise if PA required

## 2023-10-24 NOTE — Telephone Encounter (Addendum)
Ordering provider: Gillian Shields Associated diagnoses: I10-R06.83 WatchPAT PA obtained on 10/24/2023 by Latrelle Dodrill, CMA. Authorization: No; tracking ID NO PA REQ FOR HST --REF# [id number] Patient notified of PIN (1234) on 10/24/2023 via Notification Method: phone.& Mychart

## 2023-10-26 ENCOUNTER — Other Ambulatory Visit: Payer: Self-pay

## 2023-10-26 NOTE — Addendum Note (Signed)
Addended by: Jannel Lynne, Yareni Creps C on: 10/26/2023 10:46 AM   Modules accepted: Orders

## 2023-11-15 NOTE — Addendum Note (Signed)
Addended by: Maui Ahart, Harlene Petralia C on: 11/15/2023 10:10 AM   Modules accepted: Orders

## 2023-11-21 ENCOUNTER — Encounter (HOSPITAL_BASED_OUTPATIENT_CLINIC_OR_DEPARTMENT_OTHER): Payer: Self-pay | Admitting: *Deleted

## 2023-11-21 ENCOUNTER — Ambulatory Visit: Payer: BC Managed Care – PPO

## 2023-11-21 NOTE — Progress Notes (Deleted)
Office Visit    Patient Name: George Richardson Date of Encounter: 11/21/2023  Primary Care Provider:  Norm Salt, PA Primary Cardiologist:  None  Chief Complaint    Hypertension - Advanced hypertension clinic  Past Medical History                    No Known Allergies  History of Present Illness    George Richardson is a 52 y.o. male patient who was referred to the Advanced Hypertension Clinic by Norva Riffle PA.   George Richardson was diagnosed with hypertension 8 years ago around age 52 years old. It became increasingly difficult to control 2 years ago. Now working 2jobs ([employer] overnight and second shift [employer]). Blood pressure checked with wrist cuff at home which has not been checked for accuracy. PCP recently transitioned from Amlodipine and Losartan to Amlodipine-Olmesartan 5-40mg  daily. Does note that he had interruption in antihypertensive therapy as he thought BP was controlled but then notes it was markedly elevated SBP 190s off his medications. Now taking Amlodipine-Olmesartan consistently.  He was first seen in clinic by Gillian Shields, in September.   Pressure at that time was 147/86 and she added spironolactone 12.5 mg daily and ordered screening for RAS, OSA, hyperaldosteronism and pheochromocytoma.  Patient was agreeable to enroll in our RPM research and was randomized to group 2.    Today he is in the office for follow up.  Renal artery dopplers were negative for RAS, and he has not yet completed labs or home sleep study.    Blood Pressure Goal:  130/80  Current Medications: amlodipine/olmesartan 5/40 mg every day, spironolactone 12.5 mg every day   Adherence Assessment  Do you ever forget to take your medication? [] Yes [] No  Do you ever skip doses due to side effects? [] Yes [] No  Do you have trouble affording your medicines? [] Yes [] No  Are you ever unable to pick up your medication due to transportation difficulties? [] Yes [] No  Do you ever stop  taking your medications because you don't believe they are helping? [] Yes [] No  Do you check your weight daily? [] Yes [] No   Adherence strategy: ***  Barriers to obtaining medications: ***  Previously tried:    Amlodipine - swelling on 10mg  dose, tolerates 5mg  Losartan Lisinopril - cough, kidney issues Hydrochlorothiazide - urinary frequency  Family Hx:     Social Hx:      Tobacco: no  Alcohol: rarely  Caffeine:    Diet:      Exercise:   Home BP readings:  in [employer] 14 day average 140/89  (range 111-159/76-105) 30 day average 142/90 (range 111-161/75-105)   Accessory Clinical Findings    Lab Results  Component Value Date   CREATININE 1.15 09/22/2017   BUN 11 09/22/2017   NA 140 09/22/2017   K 3.6 09/22/2017   CL 106 09/22/2017   CO2 28 09/22/2017   Lab Results  Component Value Date   ALT 18 09/22/2017   AST 22 09/22/2017   ALKPHOS 55 09/22/2017   BILITOT 0.4 09/22/2017   Lab Results  Component Value Date   HGBA1C 5.8 03/20/2014    Screening for Secondary Hypertension: { Click here to document screening for secondary causes of HTN  :1}     09/21/2023    1:19 PM  Causes  Renovascular HTN Screened     - Comments 07/2023 renal duplex ordered  Thyroid Disease Screened     - Comments 05/2023 normal TSH  Hyperaldosteronism Screened     - Comments 07/2023 renin aldosterone ordered  Pheochromocytoma Screened     - Comments 07/2023 catecholamines, metanephrines ordered  Cushing's Syndrome N/A     - Comments non cushingoid appearance  Coarctation of the Aorta N/A     - Comments BP symmetrical  Compliance Screened    Relevant Labs/Studies:    Latest Ref Rng & Units 09/22/2017    1:04 AM 07/28/2016   12:51 PM 01/01/2016    4:55 PM  Basic Labs  Sodium 135 - 145 mmol/L 140  141  139   Potassium 3.5 - 5.1 mmol/L 3.6  3.9  4.4   Creatinine 0.61 - 1.24 mg/dL 1.61  0.96  0.45        Latest Ref Rng & Units 01/01/2016    4:55 PM  Thyroid   TSH 0.350 - 4.500  uIU/mL 2.010                 09/27/2023    9:14 AM  Renovascular   Renal Artery [place] Completed Yes      Home Medications    Current Outpatient Medications  Medication Sig Dispense Refill   acetaminophen (TYLENOL) 500 MG tablet Take 1,000 mg by mouth every 6 (six) hours as needed for headache.     amLODipine-olmesartan (AZOR) 5-40 MG tablet Take 1 tablet by mouth daily.     spironolactone (ALDACTONE) 25 MG tablet Take 0.5 tablets (12.5 mg total) by mouth daily. 15 tablet 2   No current facility-administered medications for this visit.     Assessment & Plan   No BP recorded.  {Refresh Note OR Click here to enter BP  :1}***   No problem-specific Assessment & Plan notes found for this encounter.   Phillips Hay PharmD CPP [facility]  [address] [place], [place] [address] [phone]

## 2023-11-26 ENCOUNTER — Other Ambulatory Visit (INDEPENDENT_AMBULATORY_CARE_PROVIDER_SITE_OTHER): Payer: Self-pay

## 2023-12-04 ENCOUNTER — Ambulatory Visit (HOSPITAL_BASED_OUTPATIENT_CLINIC_OR_DEPARTMENT_OTHER): Payer: BC Managed Care – PPO

## 2023-12-17 ENCOUNTER — Other Ambulatory Visit (HOSPITAL_BASED_OUTPATIENT_CLINIC_OR_DEPARTMENT_OTHER): Payer: Self-pay | Admitting: Family

## 2023-12-17 DIAGNOSIS — I10 Essential (primary) hypertension: Secondary | ICD-10-CM

## 2024-01-18 ENCOUNTER — Encounter (HOSPITAL_BASED_OUTPATIENT_CLINIC_OR_DEPARTMENT_OTHER): Payer: BC Managed Care – PPO | Admitting: Family

## 2024-01-18 NOTE — Progress Notes (Deleted)
 Advanced Hypertension Clinic  Assessment:    Date:  01/18/2024   ID:  George Richardson, DOB [birth year], MRN [id number]  PCP:  Norm Salt, PA  Cardiologist:  None  Nephrologist:  Referring MD: Norm Salt, PA   CC: Hypertension  History of Present Illness:    George Richardson is a 53 y.o. male with a hx of  hypertension, prediabetes, here to follow up in the Advanced Hypertension Clinic.   Established with Advanced Hypertension Clinic 09/21/23. George Richardson was diagnosed with hypertension around age 39 years old. It became increasingly difficult to control 2 years ago.  He was enrolled in [employer]ify RPM study.  Amlodipine [place] olmesartan 5 to 20 mg daily continued and spironolactone 12.5 mg daily initiated. Renal duplex 09/27/2023 with no renal artery stenosis. Itamar home sleep study provided but not yet performed. *** Renin aldosterone, catecholamines, metanephrines ordered but not collected.   Now working 2jobs ([employer] overnight and second shift [employer]). Blood pressure checked with wrist cuff at home which has not been checked for accuracy. PCP recently transitioned from Amlodipine and Losartan to Amlodipine-Olmesartan 5-40mg  daily. Does note that he had interruption in antihypertensive therapy as he thought BP was controlled but then notes it was markedly elevated SBP 190s off his medications. Now taking Amlodipine-Olmesartan consistently.  he reports tobacco use never. Alcohol use socially rarely. 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 he eats at home and does not follow low sodium diet. Does eat a decent amount of prepackaged meals. Snores and often wakes feeling tired.   06/05/23 TSH 2.57, A1c 5.8, creatinine 1.25, GFR 70, AST 16, ALT 15, total cholesterol 154, triglycerides 66, HDL 54, LDL 85, K 4.0  Previous antihypertensives: Amlodipine - swelling on 10mg  dose, tolerates 5mg  Losartan Lisinopril - cough,  kidney issues Hydrochlorothiazide - urinary frequency   Past Medical History:  Diagnosis Date   Anemia    Hypertension    Prediabetes     No past surgical history on file.  Current Medications: No outpatient medications have been marked as taking for the 01/18/24 encounter (Appointment) with Alver Sorrow, NP.     Allergies:   Patient has no known allergies.   Social History   Socioeconomic History   Marital status: Married    Spouse name: Not on file   Number of children: Not on file   Years of education: Not on file   Highest education level: Not on file  Occupational History   Not on file  Tobacco Use   Smoking status: Never   Smokeless tobacco: Never  Substance and Sexual Activity   Alcohol use: No   Drug use: No   Sexual activity: Yes    Partners: Female    Birth control/protection: Condom  Other Topics Concern   Not on file  Social History Narrative   Not on file   Social Drivers of Health   Financial Resource Strain: Not on file  Food Insecurity: Not on file  Transportation Needs: Not on file  Physical Activity: Not on file  Stress: Not on file  Social Connections: Not on file     Family History: The patient's family history includes Anemia in his mother; Cancer in his mother; Hypertension in his maternal grandmother.  ROS:   Please see the history of present illness.     All other systems reviewed and are negative.  EKGs/Labs/Other Studies Reviewed:  Recent Labs: No results found for requested labs within last 365 days.   Recent Lipid Panel    Component Value Date/Time   CHOL 149 02/14/2015 1431   TRIG 61 02/14/2015 1431   HDL 42 02/14/2015 1431   CHOLHDL 3.5 02/14/2015 1431   VLDL 12 02/14/2015 1431   LDLCALC 95 02/14/2015 1431    Physical Exam:   VS:  There were no vitals taken for this visit. , BMI There is no height or weight on file to calculate BMI. GENERAL:  Well appearing HEENT: Pupils equal round and reactive,  fundi not visualized, oral mucosa unremarkable NECK:  No jugular venous distention, waveform within normal limits, carotid upstroke brisk and symmetric, no bruits, no thyromegaly LYMPHATICS:  No cervical adenopathy LUNGS:  Clear to auscultation bilaterally HEART:  RRR.  PMI not displaced or sustained,S1 and S2 within normal limits, no S3, no S4, no clicks, no rubs, no murmurs ABD:  Flat, positive bowel sounds normal in frequency in pitch, no bruits, no rebound, no guarding, no midline pulsatile mass, no hepatomegaly, no splenomegaly EXT:  2 plus pulses throughout, no edema, no cyanosis no clubbing SKIN:  No rashes no nodules NEURO:  Cranial nerves II through XII grossly intact, motor grossly intact throughout PSYCH:  Cognitively intact, oriented to person place and time   ASSESSMENT/PLAN:    HTN - BP not at goal <130/80. Continue Amlodipine-Olmesartan 5-40mg  daily. Prior edema with higher dose Amlodipine. Prior urinary frequency with hydrochlorothiazide. Start spironolactone 12.5 mg daily. Plan for renal duplex to rule out stenosis. Sleep study, as below. Plan for renin aldosterone, catecholamines, metanephrines assess for secondary hypertension.  Hold spironolactone 48 hours prior to renal aldosterone lab collection. Enrolled in [employer] RPM study. Recommend aiming for 150 minutes of moderate intensity activity per week and following a heart healthy diet.    Snores - Notes snoring, daytime somnolence. Itamar home sleep study provided  Prediabetes - Continue to follow with PCP.   Screening for Secondary Hypertension:     09/21/2023    1:19 PM  Causes  Renovascular HTN Screened     - Comments 07/2023 renal duplex ordered  Thyroid Disease Screened     - Comments 05/2023 normal TSH  Hyperaldosteronism Screened     - Comments 07/2023 renin aldosterone ordered  Pheochromocytoma Screened     - Comments 07/2023 catecholamines, metanephrines ordered  Cushing's Syndrome N/A     - Comments non  cushingoid appearance  Coarctation of the Aorta N/A     - Comments BP symmetrical  Compliance Screened    Relevant Labs/Studies:    Latest Ref Rng & Units 09/22/2017    1:04 AM 07/28/2016   12:51 PM 01/01/2016    4:55 PM  Basic Labs  Sodium 135 - 145 mmol/L 140  141  139   Potassium 3.5 - 5.1 mmol/L 3.6  3.9  4.4   Creatinine 0.61 - 1.24 mg/dL 1.61  0.96  0.45        Latest Ref Rng & Units 01/01/2016    4:55 PM  Thyroid   TSH 0.350 - 4.500 uIU/mL 2.010                 09/27/2023    9:14 AM  Renovascular   Renal Artery [place] Completed Yes        he consents to be monitored in our remote patient monitoring program through [employer].  he will track his blood pressure twice daily and understands that these trends will  help [place] to adjust his medications as needed prior to his next appointment. ***   Disposition:    FU with MD/PharmD in 2 months ***   Medication Adjustments/Labs and Tests Ordered: Current medicines are reviewed at length with the patient today.  Concerns regarding medicines are outlined above.  No orders of the defined types were placed in this encounter.  No orders of the defined types were placed in this encounter.    Signed, Alver Sorrow, NP  01/18/2024 10:27 AM    [facility]

## 2024-03-21 ENCOUNTER — Other Ambulatory Visit (HOSPITAL_BASED_OUTPATIENT_CLINIC_OR_DEPARTMENT_OTHER): Payer: Self-pay | Admitting: Family

## 2024-03-21 DIAGNOSIS — I10 Essential (primary) hypertension: Secondary | ICD-10-CM

## 2024-04-12 ENCOUNTER — Other Ambulatory Visit (HOSPITAL_BASED_OUTPATIENT_CLINIC_OR_DEPARTMENT_OTHER): Payer: Self-pay | Admitting: Family

## 2024-04-12 DIAGNOSIS — I10 Essential (primary) hypertension: Secondary | ICD-10-CM

## 2024-06-03 ENCOUNTER — Other Ambulatory Visit (HOSPITAL_BASED_OUTPATIENT_CLINIC_OR_DEPARTMENT_OTHER): Payer: Self-pay | Admitting: Family

## 2024-06-03 DIAGNOSIS — I10 Essential (primary) hypertension: Secondary | ICD-10-CM

## 2024-09-30 ENCOUNTER — Telehealth: Payer: Self-pay

## 2024-09-30 NOTE — Telephone Encounter (Signed)
 Itamar order cancelled due to patient not completing or returning [facility] PAT ONE device
